# Patient Record
Sex: Female | Born: 1980 | Race: Black or African American | Hispanic: No | Marital: Single | Smoking: Current every day smoker
Health system: Southern US, Community
[De-identification: ages and names within clinical notes are randomized; demographics above are authoritative.]

## PROBLEM LIST (undated history)

## (undated) DIAGNOSIS — D649 Anemia, unspecified: Secondary | ICD-10-CM

## (undated) DIAGNOSIS — I10 Essential (primary) hypertension: Secondary | ICD-10-CM

## (undated) DIAGNOSIS — N809 Endometriosis, unspecified: Secondary | ICD-10-CM

## (undated) HISTORY — DX: Essential (primary) hypertension: I10

## (undated) HISTORY — PX: SALPINGECTOMY: SHX328

## (undated) HISTORY — PX: TONSILLECTOMY: SUR1361

## (undated) HISTORY — DX: Endometriosis, unspecified: N80.9

---

## 2015-12-15 ENCOUNTER — Emergency Department (HOSPITAL_COMMUNITY)
Admission: EM | Admit: 2015-12-15 | Discharge: 2015-12-15 | Disposition: A | Discharge status: Home / Self Care | Payer: Self-pay | Attending: Emergency Medicine | Admitting: Emergency Medicine

## 2015-12-15 ENCOUNTER — Encounter (HOSPITAL_COMMUNITY): Payer: Self-pay

## 2015-12-15 ENCOUNTER — Emergency Department (HOSPITAL_COMMUNITY): Payer: Self-pay

## 2015-12-15 DIAGNOSIS — F17210 Nicotine dependence, cigarettes, uncomplicated: Secondary | ICD-10-CM | POA: Insufficient documentation

## 2015-12-15 DIAGNOSIS — N200 Calculus of kidney: Secondary | ICD-10-CM | POA: Insufficient documentation

## 2015-12-15 DIAGNOSIS — Z3202 Encounter for pregnancy test, result negative: Secondary | ICD-10-CM | POA: Insufficient documentation

## 2015-12-15 DIAGNOSIS — R630 Anorexia: Secondary | ICD-10-CM | POA: Insufficient documentation

## 2015-12-15 DIAGNOSIS — N939 Abnormal uterine and vaginal bleeding, unspecified: Secondary | ICD-10-CM | POA: Insufficient documentation

## 2015-12-15 DIAGNOSIS — R112 Nausea with vomiting, unspecified: Secondary | ICD-10-CM | POA: Insufficient documentation

## 2015-12-15 LAB — COMPREHENSIVE METABOLIC PANEL
ALT: 14 U/L (ref 14–54)
AST: 19 U/L (ref 15–41)
Albumin: 4.2 g/dL (ref 3.5–5.0)
Alkaline Phosphatase: 49 U/L (ref 38–126)
Anion gap: 10 (ref 5–15)
BUN: 11 mg/dL (ref 6–20)
CHLORIDE: 106 mmol/L (ref 101–111)
CO2: 24 mmol/L (ref 22–32)
CREATININE: 0.92 mg/dL (ref 0.44–1.00)
Calcium: 9.4 mg/dL (ref 8.9–10.3)
Glucose, Bld: 117 mg/dL — ABNORMAL HIGH (ref 65–99)
POTASSIUM: 4.1 mmol/L (ref 3.5–5.1)
SODIUM: 140 mmol/L (ref 135–145)
Total Bilirubin: 0.5 mg/dL (ref 0.3–1.2)
Total Protein: 7.1 g/dL (ref 6.5–8.1)

## 2015-12-15 LAB — URINALYSIS, ROUTINE W REFLEX MICROSCOPIC
Bilirubin Urine: NEGATIVE
GLUCOSE, UA: NEGATIVE mg/dL
Ketones, ur: 15 mg/dL — AB
Nitrite: NEGATIVE
PH: 7 (ref 5.0–8.0)
PROTEIN: 100 mg/dL — AB
SPECIFIC GRAVITY, URINE: 1.029 (ref 1.005–1.030)

## 2015-12-15 LAB — I-STAT BETA HCG BLOOD, ED (MC, WL, AP ONLY): I-stat hCG, quantitative: <5 m[IU]/mL (ref <5)

## 2015-12-15 LAB — CBC
HEMATOCRIT: 38.6 % (ref 36.0–46.0)
HEMOGLOBIN: 12.5 g/dL (ref 12.0–15.0)
MCH: 26.4 pg (ref 26.0–34.0)
MCHC: 32.4 g/dL (ref 30.0–36.0)
MCV: 81.4 fL (ref 78.0–100.0)
PLATELETS: 227 10*3/uL (ref 150–400)
RBC: 4.74 MIL/uL (ref 3.87–5.11)
RDW: 13.2 % (ref 11.5–15.5)
WBC: 11.4 10*3/uL — AB (ref 4.0–10.5)

## 2015-12-15 LAB — LIPASE, BLOOD: LIPASE: 25 U/L (ref 11–51)

## 2015-12-15 LAB — URINE MICROSCOPIC-ADD ON

## 2015-12-15 MED ORDER — HYDROMORPHONE HCL 1 MG/ML IJ SOLN
1.0000 mg | Freq: Once | INTRAMUSCULAR | Status: AC
  Administered 2015-12-15: 1 mg via INTRAMUSCULAR
  Filled 2015-12-15: qty 1

## 2015-12-15 MED ORDER — ONDANSETRON 4 MG PO TBDP
ORAL_TABLET | ORAL | Status: AC
  Filled 2015-12-15: qty 1

## 2015-12-15 MED ORDER — ONDANSETRON 4 MG PO TBDP
4.0000 mg | ORAL_TABLET | Freq: Once | ORAL | Status: AC | PRN
Start: 2015-12-15 — End: 2015-12-15
  Administered 2015-12-15: 4 mg via ORAL

## 2015-12-15 MED ORDER — HYDROCODONE-ACETAMINOPHEN 5-325 MG PO TABS: 1.0000 | ORAL_TABLET | ORAL | Status: DC | PRN

## 2015-12-15 MED ORDER — KETOROLAC TROMETHAMINE 30 MG/ML IJ SOLN
30.0000 mg | Freq: Once | INTRAMUSCULAR | Status: AC
  Administered 2015-12-15: 30 mg via INTRAMUSCULAR
  Filled 2015-12-15: qty 1

## 2015-12-15 NOTE — ED Notes (Signed)
Patient with a history of endometriosis and fibroids comes to the ED with C/O abdominal pain.  Onset of abdominal pain was this morning but C/O heavy vaginal bleeding for 3 weeks.  States that she changes pads 7 times per day.  She states that she began passing large blood clots today.  C/O right lower quadrant abdominal pain.  Also C/O painful urination that began today.

## 2015-12-15 NOTE — ED Notes (Signed)
Patient unable to give Urine sample at this time

## 2015-12-15 NOTE — ED Provider Notes (Signed)
CSN: [id number]     Arrival date & time 12/15/15  1426 History   First MD Initiated Contact with Patient 12/15/15 1856     Chief Complaint  Patient presents with  . Abdominal Pain  . Emesis     (Consider location/radiation/quality/duration/timing/severity/associated sxs/prior Treatment) Patient is a 35 y.o. female presenting with abdominal pain and vomiting. The history is provided by the patient.  Abdominal Pain Pain location:  R flank and RLQ Pain quality: burning, cramping, sharp and shooting   Pain radiates to:  R flank Pain severity:  Severe Onset quality:  Sudden Duration:  8 hours Timing:  Constant Progression:  Improving Chronicity:  New Context comment:  Patient states that she has had issues with her menses for the last 3 weeks with an ongoing period but that was starting to improve when today at 10 AM she developed sudden onset severe 10 out of 10 pain causing her to vomit Relieved by:  Nothing Exacerbated by: Difficult to urinate and noticed blood in the urine. Ineffective treatments:  NSAIDs Associated symptoms: anorexia, dysuria, hematuria, nausea, vaginal bleeding and vomiting   Associated symptoms: no cough, no diarrhea, no fever and no vaginal discharge   Risk factors: has not had multiple surgeries and not pregnant   Emesis Associated symptoms: abdominal pain   Associated symptoms: no diarrhea     History reviewed. No pertinent past medical history. Past Surgical History  Procedure Laterality Date  . Tubal ligation     History reviewed. No pertinent family history. Social History  Substance Use Topics  . Smoking status: Current Every Day Smoker -- 0.50 packs/day    Types: Cigarettes  . Smokeless tobacco: None  . Alcohol Use: Yes     Comment: occ   OB History    No data available     Review of Systems  Constitutional: Negative for fever.  Respiratory: Negative for cough.   Gastrointestinal: Positive for nausea, vomiting, abdominal pain and  anorexia. Negative for diarrhea.  Genitourinary: Positive for dysuria, hematuria and vaginal bleeding. Negative for vaginal discharge.      Allergies  Review of patient's allergies indicates no known allergies.  Home Medications   Prior to Admission medications   Not on File   BP 156/91 mmHg  Pulse 57  Temp(Src) 98.1 F (36.7 C) (Oral)  Resp 16  Ht 5\' 1"  (1.549 m)  Wt 140 lb 14.4 oz (63.912 kg)  BMI 26.64 kg/m2  SpO2 100%  LMP 11/24/2015 Physical Exam  Constitutional: She is oriented to person, place, and time. She appears well-developed and well-nourished. No distress.  HENT:  Head: Normocephalic and atraumatic.  Mouth/Throat: Oropharynx is clear and moist.  Eyes: Conjunctivae and EOM are normal. Pupils are equal, round, and reactive to light.  Neck: Normal range of motion. Neck supple.  Cardiovascular: Normal rate, regular rhythm and intact distal pulses.   No murmur heard. Pulmonary/Chest: Effort normal and breath sounds normal. No respiratory distress. She has no wheezes. She has no rales.  Abdominal: Soft. She exhibits no distension. There is tenderness in the right lower quadrant. There is CVA tenderness. There is no rebound and no guarding.  Right CVA tenderness  Musculoskeletal: Normal range of motion. She exhibits no edema or tenderness.  Neurological: She is alert and oriented to person, place, and time.  Skin: Skin is warm and dry. No rash noted. No erythema.  Psychiatric: She has a normal mood and affect. Her behavior is normal.  Nursing note and vitals reviewed.  ED Course  Procedures (including critical care time) Labs Review Labs Reviewed  COMPREHENSIVE METABOLIC PANEL - Abnormal; Notable for the following:    Glucose, Bld 117 (*)    All other components within normal limits  CBC - Abnormal; Notable for the following:    WBC 11.4 (*)    All other components within normal limits  URINALYSIS, ROUTINE W REFLEX MICROSCOPIC (NOT AT [facility]) - Abnormal;  Notable for the following:    Color, Urine RED (*)    APPearance TURBID (*)    Hgb urine dipstick LARGE (*)    Ketones, ur 15 (*)    Protein, ur 100 (*)    Leukocytes, UA SMALL (*)    All other components within normal limits  URINE MICROSCOPIC-ADD ON - Abnormal; Notable for the following:    Squamous Epithelial / LPF 0-5 (*)    Bacteria, UA RARE (*)    All other components within normal limits  LIPASE, BLOOD  I-STAT BETA HCG BLOOD, ED ([facility], [facility], [facility] ONLY)    Imaging Review Ct Abdomen Pelvis Wo Contrast  12/15/2015  CLINICAL DATA:  Vaginal bleeding for 3 weeks, abnormal periods for 3 months. One day of RIGHT lower quadrant pain and dysuria, vomiting and diarrhea. EXAM: CT ABDOMEN AND PELVIS WITHOUT CONTRAST TECHNIQUE: Multidetector CT imaging of the abdomen and pelvis was performed following the standard protocol without IV contrast. COMPARISON:  None. FINDINGS: LUNG BASES: Included view of the lung bases are clear. The visualized heart and pericardium are unremarkable. KIDNEYS/BLADDER: Kidneys are orthotopic, demonstrating normal size and morphology. No nephrolithiasis, hydronephrosis; limited assessment for renal masses on this nonenhanced examination. The unopacified ureters are normal in course and caliber. Urinary bladder is partly distended, with homogeneously dense contents, 38 Hounsfield units. SOLID ORGANS: The liver, spleen, gallbladder, pancreas and adrenal glands are unremarkable for this non-contrast examination. GASTROINTESTINAL TRACT: The stomach, small and large bowel are normal in course and caliber without inflammatory changes, the sensitivity may be decreased by lack of enteric contrast. Normal appendix. PERITONEUM/RETROPERITONEUM: Aortoiliac vessels are normal in course and caliber. No lymphadenopathy by CT size criteria. Internal reproductive organs are unremarkable. No intraperitoneal free fluid nor free air. Phleboliths in the pelvis. SOFT TISSUES/ OSSEOUS STRUCTURES:   Nonsuspicious. IMPRESSION: No urolithiasis or obstructive uropathy. Mildly dense bladder contents, could represent blood products. Recommend correlation with urinary analysis. Normal appendix. Electronically Signed   By: Elon Alas M.D.   On: 12/15/2015 21:02   I have personally reviewed and evaluated these images and lab results as part of my medical decision-making.   EKG Interpretation None      MDM   Final diagnoses:  Kidney stone on right side    Pt with symptoms concerning for right sided kidney stone vs ovarian cyst vs torsion that started at 10am today.  Denies infectious sx, or GI symptoms.  Low concern for diverticulitis and no risk factors or history suggestive of AAA.  pt is healthy.  She denies vaginal d/c and no recent sexual activity.  Pt has RLQ and right flank pain on exam and today has had hematuria.  Patient does have a history of fibroids and endometriosis and states she has had a similar pain with that but not quite as severe as today.  Will  treat pain and ensure no infection with UA WHICH IS GROSSLY BLOODY ON EXAM. CBC, CMP and hCG are all within normal limits except for mild leukocytosis of 11. will get stone study to further eval.  9:32  PM CT showing blood products in the bladder without other acute findings. UA consistent with blood and given patient's history most concerning for a kidney stone. Patient's pain is significantly improved and she is tolerating fluids. Will discharge home with some pain medication and follow-up with urology symptoms do not resolve.   Blanchie Dessert, MD 12/15/15 2132

## 2015-12-15 NOTE — ED Notes (Signed)
Pt stable, ambulatory, states understanding of discharge instructions 

## 2015-12-15 NOTE — Discharge Instructions (Signed)
Kidney Stones Kidney stones (urolithiasis) are deposits that form inside your kidneys. The intense pain is caused by the stone moving through the urinary tract. When the stone moves, the ureter goes into spasm around the stone. The stone is usually passed in the urine.  CAUSES   A disorder that makes certain neck glands produce too much parathyroid hormone (primary hyperparathyroidism).  A buildup of uric acid crystals, similar to gout in your joints.  Narrowing (stricture) of the ureter.  A kidney obstruction present at birth (congenital obstruction).  Previous surgery on the kidney or ureters.  Numerous kidney infections. SYMPTOMS   Feeling sick to your stomach (nauseous).  Throwing up (vomiting).  Blood in the urine (hematuria).  Pain that usually spreads (radiates) to the groin.  Frequency or urgency of urination. DIAGNOSIS   Taking a history and physical exam.  Blood or urine tests.  CT scan.  Occasionally, an examination of the inside of the urinary bladder (cystoscopy) is performed. TREATMENT   Observation.  Increasing your fluid intake.  Extracorporeal shock wave lithotripsy--This is a noninvasive procedure that uses shock waves to break up kidney stones.  Surgery may be needed if you have severe pain or persistent obstruction. There are various surgical procedures. Most of the procedures are performed with the use of small instruments. Only small incisions are needed to accommodate these instruments, so recovery time is minimized. The size, location, and chemical composition are all important variables that will determine the proper choice of action for you. Talk to your health care provider to better understand your situation so that you will minimize the risk of injury to yourself and your kidney.  HOME CARE INSTRUCTIONS   Drink enough water and fluids to keep your urine clear or pale yellow. This will help you to pass the stone or stone fragments.  Strain  all urine through the provided strainer. Keep all particulate matter and stones for your health care provider to see. The stone causing the pain may be as small as a grain of salt. It is very important to use the strainer each and every time you pass your urine. The collection of your stone will allow your health care provider to analyze it and verify that a stone has actually passed. The stone analysis will often identify what you can do to reduce the incidence of recurrences.  Only take over-the-counter or prescription medicines for pain, discomfort, or fever as directed by your health care provider.  Keep all follow-up visits as told by your health care provider. This is important.  Get follow-up X-rays if required. The absence of pain does not always mean that the stone has passed. It may have only stopped moving. If the urine remains completely obstructed, it can cause loss of kidney function or even complete destruction of the kidney. It is your responsibility to make sure X-rays and follow-ups are completed. Ultrasounds of the kidney can show blockages and the status of the kidney. Ultrasounds are not associated with any radiation and can be performed easily in a matter of minutes.  Make changes to your daily diet as told by your health care provider. You may be told to:  Limit the amount of salt that you eat.  Eat 5 or more servings of fruits and vegetables each day.  Limit the amount of meat, poultry, fish, and eggs that you eat.  Collect a 24-hour urine sample as told by your health care provider.You may need to collect another urine sample every 6-12   months. SEEK MEDICAL CARE IF:  You experience pain that is progressive and unresponsive to any pain medicine you have been prescribed. SEEK IMMEDIATE MEDICAL CARE IF:   Pain cannot be controlled with the prescribed medicine.  You have a fever or shaking chills.  The severity or intensity of pain increases over 18 hours and is not  relieved by pain medicine.  You develop a new onset of abdominal pain.  You feel faint or pass out.  You are unable to urinate.   This information is not intended to replace advice given to you by your health care provider. Make sure you discuss any questions you have with your health care provider.   Document Released: 11/23/2005 Document Revised: 08/14/2015 Document Reviewed: 04/26/2013 Elsevier Interactive Patient Education 2016 [employer] Dietary Guidelines to Help Prevent Kidney Stones Your risk of kidney stones can be decreased by adjusting the foods you eat. The most important thing you can do is drink enough fluid. You should drink enough fluid to keep your urine clear or pale yellow. The following guidelines provide specific information for the type of kidney stone you have had. GUIDELINES ACCORDING TO TYPE OF KIDNEY STONE Calcium Oxalate Kidney Stones  Reduce the amount of salt you eat. Foods that have a lot of salt cause your body to release excess calcium into your urine. The excess calcium can combine with a substance called oxalate to form kidney stones.  Reduce the amount of animal protein you eat if the amount you eat is excessive. Animal protein causes your body to release excess calcium into your urine. Ask your dietitian how much protein from animal sources you should be eating.  Avoid foods that are high in oxalates. If you take vitamins, they should have less than 500 mg of vitamin C. Your body turns vitamin C into oxalates. You do not need to avoid fruits and vegetables high in vitamin C. Calcium Phosphate Kidney Stones  Reduce the amount of salt you eat to help prevent the release of excess calcium into your urine.  Reduce the amount of animal protein you eat if the amount you eat is excessive. Animal protein causes your body to release excess calcium into your urine. Ask your dietitian how much protein from animal sources you should be eating.  Get enough  calcium from food or take a calcium supplement (ask your dietitian for recommendations). Food sources of calcium that do not increase your risk of kidney stones include:  Broccoli.  Dairy products, such as cheese and yogurt.  Pudding. Uric Acid Kidney Stones  Do not have more than 6 oz of animal protein per day. FOOD SOURCES Animal Protein Sources  Meat (all types).  Poultry.  Eggs.  Fish, seafood. Foods High in Salt  Salt seasonings.  Soy sauce.  Teriyaki sauce.  Cured and processed meats.  Salted crackers and snack foods.  Fast food.  Canned soups and most canned foods. Foods High in Oxalates  Grains:  Amaranth.  Barley.  Grits.  Wheat germ.  Bran.  Buckwheat flour.  All bran cereals.  Pretzels.  Whole wheat bread.  Vegetables:  Beans (wax).  Beets and beet greens.  Collard greens.  Eggplant.  Escarole.  Leeks.  Okra.  Parsley.  Rutabagas.  Spinach.  Swiss chard.  Tomato paste.  Fried potatoes.  Sweet potatoes.  Fruits:  Red currants.  Figs.  Kiwi.  Rhubarb.  Meat and Other Protein Sources:  Beans (dried).  Soy burgers and other soybean products.  Miso.    Nuts (peanuts, almonds, pecans, cashews, hazelnuts).  Nut butters.  Sesame seeds and tahini (paste made of sesame seeds).  Poppy seeds.  Beverages:  Chocolate drink mixes.  Soy milk.  Instant iced tea.  Juices made from high-oxalate fruits or vegetables.  Other:  Carob.  Chocolate.  Fruitcake.  Marmalades.   This information is not intended to replace advice given to you by your health care provider. Make sure you discuss any questions you have with your health care provider.   Document Released: 03/20/2011 Document Revised: 11/28/2013 Document Reviewed: 10/20/2013 Elsevier Interactive Patient Education 2016 Elsevier Inc.  

## 2015-12-15 NOTE — ED Notes (Signed)
Pt has had vaginal bleeding x 3 weeks.  Abnormal periods x 3 months.  Onset today RLQ abd pain and dysuria.  Vomited x 3 today, diarrhea x 5- loose, unable to determine color d/t vaginal bleeding in toilet.

## 2016-01-08 ENCOUNTER — Ambulatory Visit (INDEPENDENT_AMBULATORY_CARE_PROVIDER_SITE_OTHER): Payer: Self-pay | Admitting: Obstetrics & Gynecology

## 2016-01-08 ENCOUNTER — Encounter: Payer: Self-pay | Admitting: Obstetrics & Gynecology

## 2016-01-08 VITALS — BP 144/71 | HR 69 | Temp 98.4°F | Resp 20 | Ht 61.0 in | Wt 135.4 lb

## 2016-01-08 DIAGNOSIS — N946 Dysmenorrhea, unspecified: Secondary | ICD-10-CM

## 2016-01-08 MED ORDER — NORGESTIMATE-ETH ESTRADIOL 0.25-35 MG-MCG PO TABS: 1.0000 | ORAL_TABLET | Freq: Every day | ORAL | Status: DC

## 2016-01-08 MED ORDER — NAPROXEN 500 MG PO TABS: 500.0000 mg | ORAL_TABLET | Freq: Two times a day (BID) | ORAL | Status: DC

## 2016-01-08 NOTE — Progress Notes (Signed)
Pt was seen @ [facility] for pelvic pain.  Pt has Hx of fibroids and ovarian cysts.

## 2016-01-08 NOTE — Patient Instructions (Signed)

## 2016-01-08 NOTE — Progress Notes (Signed)
Patient ID: Diana Butler, female   DOB: 1980/12/30, 35 y.o.   MRN: [id number] History:  35 y.o. G2P0020 here today for eval of pelvic pain. Pt reports >83months of pelvic pain.  She reports that she was told that she had endometriosis. No surgical dx.  She was seen in the ED 12/15/2015  Pt reports h/o ov cyst.  She also reports /o kidney stones. Pt was diagnosed in [place] with endometriosis and fibroids. Last seen 5 months prev and had a sono at that time. Pain is only with cycles.  But, for the past 3 months pt has had irreg menses.  Pt was on OCP's but, it did not help. She took it less than 2 months and stopped due to side effects.  She thinks it was Orthotricyclen.  Pt was also on Depo Provera as a teenager.  She says she would not get a shot again.    Both fallopian tubes prev removed due to h/o ectopic pregnancies.   The following portions of the patient's history were reviewed and updated as appropriate: allergies, current medications, past family history, past medical history, past social history, past surgical history and problem list.  Review of Systems:  Pertinent items are noted in HPI.  Objective:  Physical Exam Blood pressure 144/71, pulse 69, temperature 98.4 F (36.9 C), temperature source Oral, resp. rate 20, height 5\' 1"  (1.549 m), weight 135 lb 6.4 oz (61.417 kg), last menstrual period 12/15/2015. Gen: NAD Abd: Soft, nontender and nondistended Pelvic: Normal appearing external genitalia; normal appearing vaginal mucosa and cervix.  Normal discharge.  Small uterus, no other palpable masses, no uterine or adnexal tenderness  Labs and Imaging Ct Abdomen Pelvis Wo Contrast  12/15/2015  CLINICAL DATA:  Vaginal bleeding for 3 weeks, abnormal periods for 3 months. One day of RIGHT lower quadrant pain and dysuria, vomiting and diarrhea. EXAM: CT ABDOMEN AND PELVIS WITHOUT CONTRAST TECHNIQUE: Multidetector CT imaging of the abdomen and pelvis was performed following the standard  protocol without IV contrast. COMPARISON:  None. FINDINGS: LUNG BASES: Included view of the lung bases are clear. The visualized heart and pericardium are unremarkable. KIDNEYS/BLADDER: Kidneys are orthotopic, demonstrating normal size and morphology. No nephrolithiasis, hydronephrosis; limited assessment for renal masses on this nonenhanced examination. The unopacified ureters are normal in course and caliber. Urinary bladder is partly distended, with homogeneously dense contents, 38 Hounsfield units. SOLID ORGANS: The liver, spleen, gallbladder, pancreas and adrenal glands are unremarkable for this non-contrast examination. GASTROINTESTINAL TRACT: The stomach, small and large bowel are normal in course and caliber without inflammatory changes, the sensitivity may be decreased by lack of enteric contrast. Normal appendix. PERITONEUM/RETROPERITONEUM: Aortoiliac vessels are normal in course and caliber. No lymphadenopathy by CT size criteria. Internal reproductive organs are unremarkable. No intraperitoneal free fluid nor free air. Phleboliths in the pelvis. SOFT TISSUES/ OSSEOUS STRUCTURES:  Nonsuspicious. IMPRESSION: No urolithiasis or obstructive uropathy. Mildly dense bladder contents, could represent blood products. Recommend correlation with urinary analysis. Normal appendix. Electronically Signed   By: Elon Alas M.D.   On: 12/15/2015 21:02    Assessment & Plan:  Dysmenorrhea Need records from [facility] in [place], Andrew 2 po q day for 4 days and then dfaily F/u in 4 months  Nazaire Cordial L. Harraway-Smith, M.D., Cherlynn June

## 2016-01-09 ENCOUNTER — Encounter: Payer: Self-pay | Admitting: Obstetrics & Gynecology

## 2017-02-25 ENCOUNTER — Ambulatory Visit (HOSPITAL_COMMUNITY): Payer: Self-pay

## 2017-04-27 ENCOUNTER — Other Ambulatory Visit: Payer: Self-pay | Admitting: Obstetrics & Gynecology

## 2017-04-27 DIAGNOSIS — N946 Dysmenorrhea, unspecified: Secondary | ICD-10-CM

## 2017-04-27 MED ORDER — NAPROXEN 500 MG PO TABS: 500.0000 mg | ORAL_TABLET | Freq: Two times a day (BID) | ORAL | 3 refills | Status: DC

## 2017-06-17 ENCOUNTER — Inpatient Hospital Stay (HOSPITAL_COMMUNITY): Payer: Self-pay

## 2017-06-17 ENCOUNTER — Inpatient Hospital Stay (HOSPITAL_COMMUNITY)
Admission: AD | Admit: 2017-06-17 | Discharge: 2017-06-18 | Disposition: A | Discharge status: Home / Self Care | Payer: Self-pay | Source: Ambulatory Visit | Attending: Obstetrics & Gynecology | Admitting: Obstetrics & Gynecology

## 2017-06-17 ENCOUNTER — Encounter (HOSPITAL_COMMUNITY): Payer: Self-pay | Admitting: *Deleted

## 2017-06-17 DIAGNOSIS — D252 Subserosal leiomyoma of uterus: Secondary | ICD-10-CM | POA: Insufficient documentation

## 2017-06-17 DIAGNOSIS — Z8742 Personal history of other diseases of the female genital tract: Secondary | ICD-10-CM | POA: Insufficient documentation

## 2017-06-17 DIAGNOSIS — R1031 Right lower quadrant pain: Secondary | ICD-10-CM | POA: Insufficient documentation

## 2017-06-17 DIAGNOSIS — N946 Dysmenorrhea, unspecified: Secondary | ICD-10-CM | POA: Insufficient documentation

## 2017-06-17 DIAGNOSIS — D259 Leiomyoma of uterus, unspecified: Secondary | ICD-10-CM

## 2017-06-17 DIAGNOSIS — F17210 Nicotine dependence, cigarettes, uncomplicated: Secondary | ICD-10-CM | POA: Insufficient documentation

## 2017-06-17 LAB — COMPREHENSIVE METABOLIC PANEL WITH GFR
ALT: 11 U/L — ABNORMAL LOW (ref 14–54)
AST: 18 U/L (ref 15–41)
Albumin: 4.3 g/dL (ref 3.5–5.0)
Alkaline Phosphatase: 49 U/L (ref 38–126)
Anion gap: 6 (ref 5–15)
BUN: 11 mg/dL (ref 6–20)
CO2: 25 mmol/L (ref 22–32)
Calcium: 9.2 mg/dL (ref 8.9–10.3)
Chloride: 106 mmol/L (ref 101–111)
Creatinine, Ser: 0.94 mg/dL (ref 0.44–1.00)
GFR calc Af Amer: >60 mL/min
GFR calc non Af Amer: >60 mL/min
Glucose, Bld: 97 mg/dL (ref 65–99)
Potassium: 4 mmol/L (ref 3.5–5.1)
Sodium: 137 mmol/L (ref 135–145)
Total Bilirubin: 0.7 mg/dL (ref 0.3–1.2)
Total Protein: 7.4 g/dL (ref 6.5–8.1)

## 2017-06-17 LAB — URINALYSIS, ROUTINE W REFLEX MICROSCOPIC
GLUCOSE, UA: NEGATIVE mg/dL
Ketones, ur: NEGATIVE mg/dL
NITRITE: NEGATIVE
PROTEIN: 100 mg/dL — AB
Specific Gravity, Urine: 1.032 — ABNORMAL HIGH (ref 1.005–1.030)
WBC UA: NONE SEEN WBC/hpf (ref 0–5)
pH: 6 (ref 5.0–8.0)

## 2017-06-17 LAB — DIFFERENTIAL
Basophils Absolute: 0 10*3/uL (ref 0.0–0.1)
Basophils Relative: 0 %
EOS PCT: 0 %
Eosinophils Absolute: 0 10*3/uL (ref 0.0–0.7)
LYMPHS PCT: 13 %
Lymphs Abs: 2.1 10*3/uL (ref 0.7–4.0)
MONO ABS: 0.3 10*3/uL (ref 0.1–1.0)
Monocytes Relative: 2 %
Neutro Abs: 13.3 10*3/uL — ABNORMAL HIGH (ref 1.7–7.7)
Neutrophils Relative %: 85 %

## 2017-06-17 LAB — POCT PREGNANCY, URINE: Preg Test, Ur: NEGATIVE

## 2017-06-17 LAB — WET PREP, GENITAL
Sperm: NONE SEEN
Trich, Wet Prep: NONE SEEN
Yeast Wet Prep HPF POC: NONE SEEN

## 2017-06-17 LAB — CBC
HCT: 37.4 % (ref 36.0–46.0)
Hemoglobin: 12 g/dL (ref 12.0–15.0)
MCH: 24.9 pg — AB (ref 26.0–34.0)
MCHC: 32.1 g/dL (ref 30.0–36.0)
MCV: 77.6 fL — ABNORMAL LOW (ref 78.0–100.0)
PLATELETS: 222 10*3/uL (ref 150–400)
RBC: 4.82 MIL/uL (ref 3.87–5.11)
RDW: 16.2 % — AB (ref 11.5–15.5)
WBC: 15.6 10*3/uL — ABNORMAL HIGH (ref 4.0–10.5)

## 2017-06-17 MED ORDER — IOPAMIDOL (ISOVUE-300) INJECTION 61%
100.0000 mL | Freq: Once | INTRAVENOUS | Status: AC | PRN
  Administered 2017-06-18: 100 mL via INTRAVENOUS

## 2017-06-17 MED ORDER — TRAMADOL HCL 50 MG PO TABS
50.0000 mg | ORAL_TABLET | Freq: Four times a day (QID) | ORAL | Status: DC | PRN
  Administered 2017-06-17: 50 mg via ORAL
  Filled 2017-06-17: qty 1

## 2017-06-17 MED ORDER — IOPAMIDOL (ISOVUE-300) INJECTION 61%
30.0000 mL | INTRAVENOUS | Status: AC
  Administered 2017-06-17 (×2): 30 mL via ORAL

## 2017-06-17 MED ORDER — OXYCODONE-ACETAMINOPHEN 5-325 MG PO TABS
2.0000 | ORAL_TABLET | Freq: Four times a day (QID) | ORAL | Status: DC | PRN
  Administered 2017-06-17: 2 via ORAL
  Filled 2017-06-17: qty 2

## 2017-06-17 NOTE — MAU Note (Signed)
Pain started an hour ago. Patient reportedly passed out at work 45 minutes ago ([employer]) from pain and has taken one naproxen.

## 2017-06-17 NOTE — [facility] Provider Note (Signed)
History     CSN: [id number]  Arrival date and time: 06/17/17 1749   First Provider Initiated Contact with Patient 06/17/17 1809     Chief Complaint  Patient presents with  . Abdominal Pain   Non-pregnant female here with lower abdominal cramping. Sx started today.She used Naproxen and had little relief. The pain worsened about 1 hr ago and she passed out. Rates pain 20/10. Menses started today. She has changed 3 pads since 1300 and passed a few small to moderate sized clots. No fevers. No urinary sx. One episode of emesis since arrival. No new partner in 5 years and current separation from him. She has hx of dysmenorrhea and feels the cramping is related.    Past Medical History:  Diagnosis Date  . Endometriosis     Past Surgical History:  Procedure Laterality Date  . TONSILLECTOMY     age 36  . TUBAL LIGATION      No family history on file.  Social History  Substance Use Topics  . Smoking status: Current Every Day Smoker    Packs/day: 0.25    Years: 10.00    Types: Cigarettes  . Smokeless tobacco: Not on file  . Alcohol use Yes     Comment: occ    Allergies: No Known Allergies  Prescriptions Prior to Admission  Medication Sig Dispense Refill Last Dose  . naproxen (NAPROSYN) 500 MG tablet Take 1 tablet (500 mg total) by mouth 2 (two) times daily with a meal. 40 tablet 3 06/17/2017 at 1400  . HYDROcodone-acetaminophen (NORCO/VICODIN) 5-325 MG tablet Take 1-2 tablets by mouth every 4 (four) hours as needed. 6 tablet 0 More than a month at Unknown time  . ibuprofen (ADVIL,MOTRIN) 200 MG tablet Take 200 mg by mouth every 6 (six) hours as needed.   More than a month at Unknown time  . norgestimate-ethinyl estradiol (ORTHO-CYCLEN,SPRINTEC,PREVIFEM) 0.25-35 MG-MCG tablet Take 1 tablet by mouth daily. 1 Package 11 More than a month at Unknown time    Review of Systems Physical Exam   Blood pressure (!) 114/95, pulse (!) 57, temperature 97.9 F (36.6 C), temperature source  Oral, resp. rate (!) 22, height 5\' 1"  (1.549 m), weight 147 lb (66.7 kg).  Physical Exam  Constitutional: She is oriented to person, place, and time. She appears well-developed and well-nourished. No distress (appears uncomfortable).  HENT:  Head: Normocephalic and atraumatic.  Neck: Normal range of motion.  Respiratory: Effort normal. No respiratory distress.  GI: Soft. She exhibits no distension. There is tenderness in the right lower quadrant. There is no rigidity, no rebound and no guarding.  Genitourinary:  Genitourinary Comments: External: no lesions or erythema Vagina: rugated, pink, moist, small drk bloody discharge Uterus: non enlarged, anteverted, non tender, no CMT Adnexae: no masses, no tenderness left, + tenderness right   Musculoskeletal: Normal range of motion.  Neurological: She is alert and oriented to person, place, and time.  Skin: Skin is warm and dry.  Psychiatric: She has a normal mood and affect.   Results for orders placed or performed during the hospital encounter of 06/17/17 (from the past 24 hour(s))  Wet prep, genital     Status: Abnormal   Collection Time: 06/17/17  6:20 PM  Result Value Ref Range   Yeast Wet Prep HPF POC NONE SEEN NONE SEEN   Trich, Wet Prep NONE SEEN NONE SEEN   Clue Cells Wet Prep HPF POC PRESENT (A) NONE SEEN   WBC, Wet Prep HPF POC FEW (  A) NONE SEEN   Sperm NONE SEEN   Urinalysis, Routine w reflex microscopic     Status: Abnormal   Collection Time: 06/17/17  6:20 PM  Result Value Ref Range   Color, Urine AMBER (A) YELLOW   APPearance HAZY (A) CLEAR   Specific Gravity, Urine 1.032 (H) 1.005 - 1.030   pH 6.0 5.0 - 8.0   Glucose, UA NEGATIVE NEGATIVE mg/dL   Hgb urine dipstick MODERATE (A) NEGATIVE   Bilirubin Urine SMALL (A) NEGATIVE   Ketones, ur NEGATIVE NEGATIVE mg/dL   Protein, ur 100 (A) NEGATIVE mg/dL   Nitrite NEGATIVE NEGATIVE   Leukocytes, UA SMALL (A) NEGATIVE   RBC / HPF TOO NUMEROUS TO COUNT 0 - 5 RBC/hpf   WBC,  UA NONE SEEN 0 - 5 WBC/hpf   Bacteria, UA FEW (A) NONE SEEN   Squamous Epithelial / LPF 6-30 (A) NONE SEEN   Mucous PRESENT   CBC     Status: Abnormal   Collection Time: 06/17/17  6:54 PM  Result Value Ref Range   WBC 15.6 (H) 4.0 - 10.5 K/uL   RBC 4.82 3.87 - 5.11 MIL/uL   Hemoglobin 12.0 12.0 - 15.0 g/dL   HCT 37.4 36.0 - 46.0 %   MCV 77.6 (L) 78.0 - 100.0 fL   MCH 24.9 (L) 26.0 - 34.0 pg   MCHC 32.1 30.0 - 36.0 g/dL   RDW 16.2 (H) 11.5 - 15.5 %   Platelets 222 150 - 400 K/uL  Differential     Status: Abnormal   Collection Time: 06/17/17  6:54 PM  Result Value Ref Range   Neutrophils Relative % 85 %   Neutro Abs 13.3 (H) 1.7 - 7.7 K/uL   Lymphocytes Relative 13 %   Lymphs Abs 2.1 0.7 - 4.0 K/uL   Monocytes Relative 2 %   Monocytes Absolute 0.3 0.1 - 1.0 K/uL   Eosinophils Relative 0 %   Eosinophils Absolute 0.0 0.0 - 0.7 K/uL   Basophils Relative 0 %   Basophils Absolute 0.0 0.0 - 0.1 K/uL  Comprehensive metabolic panel     Status: Abnormal   Collection Time: 06/17/17  6:55 PM  Result Value Ref Range   Sodium 137 135 - 145 mmol/L   Potassium 4.0 3.5 - 5.1 mmol/L   Chloride 106 101 - 111 mmol/L   CO2 25 22 - 32 mmol/L   Glucose, Bld 97 65 - 99 mg/dL   BUN 11 6 - 20 mg/dL   Creatinine, Ser 0.94 0.44 - 1.00 mg/dL   Calcium 9.2 8.9 - 10.3 mg/dL   Total Protein 7.4 6.5 - 8.1 g/dL   Albumin 4.3 3.5 - 5.0 g/dL   AST 18 15 - 41 U/L   ALT 11 (L) 14 - 54 U/L   Alkaline Phosphatase 49 38 - 126 U/L   Total Bilirubin 0.7 0.3 - 1.2 mg/dL   GFR calc non Af Amer >60 >60 mL/min   GFR calc Af Amer >60 >60 mL/min   Anion gap 6 5 - 15  Pregnancy, urine POC     Status: None   Collection Time: 06/17/17  7:15 PM  Result Value Ref Range   Preg Test, Ur NEGATIVE NEGATIVE   US Transvaginal Non-ob  Result Date: 06/17/2017 CLINICAL DATA:  RIGHT lower quadrant pain. History of endometriosis. EXAM: TRANSABDOMINAL AND TRANSVAGINAL ULTRASOUND OF PELVIS TECHNIQUE: Both transabdominal and  transvaginal ultrasound examinations of the pelvis were performed. Transabdominal technique was performed for global imaging of  the pelvis including uterus, ovaries, adnexal regions, and pelvic cul-de-sac. It was necessary to proceed with endovaginal exam following the transabdominal exam to visualize the endometrium. COMPARISON:  None FINDINGS: Uterus Measurements: 8 x 4.3 x 4.7 cm. Isoechoic exophytic RIGHT uterine fundal 2.8 x 1.8 x 2.2 cm intramural to subserosal. 6 x 6 x 6 mm LEFT uterine fundal subserosal leiomyoma. Endometrium Thickness: 15 mm. 2.1 x 1 x 1.5 cm submucosal heterogeneous mass with marginal vascularity. Right ovary Measurements: 3.2 x 2.3 x 2.4 cm. Normal appearance/no adnexal mass. Left ovary Measurements: 2.5 x 1.8 x 2.2 cm. Normal appearance/no adnexal mass. Other findings No abnormal free fluid. IMPRESSION: 1. 2.1 x 1.5 x 1.5 cm submucosal leiomyoma, less likely polyp. Findings may be better characterized with hysteroscopy from nonemergent basis. 2. 2 additional leiomyomas, measuring to 2.8 cm. Electronically Signed   By: Elon Alas M.D.   On: 06/17/2017 22:00   US Pelvis Complete  Result Date: 06/17/2017 CLINICAL DATA:  RIGHT lower quadrant pain. History of endometriosis. EXAM: TRANSABDOMINAL AND TRANSVAGINAL ULTRASOUND OF PELVIS TECHNIQUE: Both transabdominal and transvaginal ultrasound examinations of the pelvis were performed. Transabdominal technique was performed for global imaging of the pelvis including uterus, ovaries, adnexal regions, and pelvic cul-de-sac. It was necessary to proceed with endovaginal exam following the transabdominal exam to visualize the endometrium. COMPARISON:  None FINDINGS: Uterus Measurements: 8 x 4.3 x 4.7 cm. Isoechoic exophytic RIGHT uterine fundal 2.8 x 1.8 x 2.2 cm intramural to subserosal. 6 x 6 x 6 mm LEFT uterine fundal subserosal leiomyoma. Endometrium Thickness: 15 mm. 2.1 x 1 x 1.5 cm submucosal heterogeneous mass with marginal  vascularity. Right ovary Measurements: 3.2 x 2.3 x 2.4 cm. Normal appearance/no adnexal mass. Left ovary Measurements: 2.5 x 1.8 x 2.2 cm. Normal appearance/no adnexal mass. Other findings No abnormal free fluid. IMPRESSION: 1. 2.1 x 1.5 x 1.5 cm submucosal leiomyoma, less likely polyp. Findings may be better characterized with hysteroscopy from nonemergent basis. 2. 2 additional leiomyomas, measuring to 2.8 cm. Electronically Signed   By: Elon Alas M.D.   On: 06/17/2017 22:00   Ct Abdomen Pelvis W Contrast  Result Date: 06/18/2017 CLINICAL DATA:  Right lower quadrant pain. Lower abdominal cramping. EXAM: CT ABDOMEN AND PELVIS WITH CONTRAST TECHNIQUE: Multidetector CT imaging of the abdomen and pelvis was performed using the standard protocol following bolus administration of intravenous contrast. CONTRAST:  122mL ISOVUE-300 IOPAMIDOL (ISOVUE-300) INJECTION 61% COMPARISON:  Pelvic ultrasound 3 hours prior. Noncontrast CT 12/15/2015 FINDINGS: Lower chest: The lung bases are clear. No consolidation or pleural fluid. Hepatobiliary: No focal liver abnormality is seen. No gallstones, gallbladder wall thickening, or biliary dilatation. Pancreas: No ductal dilatation or inflammation. Spleen: Normal in size without focal abnormality. Splenule at the hilum. Adrenals/Urinary Tract: Normal adrenal glands. No hydronephrosis or perinephric edema. Homogeneous renal enhancement. Urinary bladder is physiologically distended, no bladder wall thickening. Stomach/Bowel: Stomach is physiologically distended. No bowel inflammation, wall thickening or abnormal distention. Normal appendix. Small to moderate colonic stool burden. Vascular/Lymphatic: Incidental note of retroaortic left renal vein. Abdominal aorta is normal in caliber. No abdominal or pelvic adenopathy. Reproductive: Heterogeneous fibroid uterus, likely due to underlying fibroids as seen on recent pelvic ultrasound. Ovaries are symmetric in size. Other: No free  air, free fluid, or intra-abdominal fluid collection. Musculoskeletal: There are no acute or suspicious osseous abnormalities. IMPRESSION: Normal appendix.  No acute abnormality in the abdomen/pelvis. Uterine fibroids, better characterized on pelvic ultrasound. Electronically Signed   By: Fonnie Birkenhead.D.  On: 06/18/2017 00:42   MAU Course  Procedures Ultram Percocet  MDM Labs ordered and reviewed. Pelvic US ordered. CT of abd and pelvis ordered. Transfer of care given to [facility], . Julianne Handler, CNM  06/17/2017 10:13 PM   Pt remains afebrile & VSS Pain resolved with percocet CT scan negative Ultrasound shows small uterine fibroid Patient has previously been seen in [facility] for dysmenorrhea & would like to return there. Assessment and Plan  A:  1. Dysmenorrhea   2. RLQ abdominal pain   3. Uterine leiomyoma, unspecified location    P; Discharge home Rx percocet Msg to [facility] for f/u appt Discussed reasons to return to MAU vs ED  Jorje Guild, NP

## 2017-06-18 DIAGNOSIS — D259 Leiomyoma of uterus, unspecified: Secondary | ICD-10-CM

## 2017-06-18 DIAGNOSIS — N946 Dysmenorrhea, unspecified: Secondary | ICD-10-CM

## 2017-06-18 DIAGNOSIS — R1031 Right lower quadrant pain: Secondary | ICD-10-CM

## 2017-06-18 LAB — GC/CHLAMYDIA PROBE AMP (~~LOC~~) NOT AT ARMC
Chlamydia: NEGATIVE
NEISSERIA GONORRHEA: NEGATIVE

## 2017-06-18 MED ORDER — OXYCODONE-ACETAMINOPHEN 5-325 MG PO TABS: 1.0000 | ORAL_TABLET | Freq: Four times a day (QID) | ORAL | 0 refills | Status: DC | PRN

## 2017-06-18 NOTE — Discharge Instructions (Signed)
Dysmenorrhea °Menstrual cramps (dysmenorrhea) are caused by the muscles of the uterus tightening (contracting) during a menstrual period. For some women, this discomfort is merely bothersome. For others, dysmenorrhea can be severe enough to interfere with everyday activities for a few days each month. °Primary dysmenorrhea is menstrual cramps that last a couple of days when you start having menstrual periods or soon after. This often begins after a teenager starts having her period. As a woman gets older or has a baby, the cramps will usually lessen or disappear. Secondary dysmenorrhea begins later in life, lasts longer, and the pain may be stronger than primary dysmenorrhea. The pain may start before the period and last a few days after the period. °What are the causes? °Dysmenorrhea is usually caused by an underlying problem, such as: °· The tissue lining the uterus grows outside of the uterus in other areas of the body (endometriosis). °· The endometrial tissue, which normally lines the uterus, is found in or grows into the muscular walls of the uterus (adenomyosis). °· The pelvic blood vessels are engorged with blood just before the menstrual period (pelvic congestive syndrome). °· Overgrowth of cells (polyps) in the lining of the uterus or cervix. °· Falling down of the uterus (prolapse) because of loose or stretched ligaments. °· Depression. °· Bladder problems, infection, or inflammation. °· Problems with the intestine, a tumor, or irritable bowel syndrome. °· Cancer of the female organs or bladder. °· A severely tipped uterus. °· A very tight opening or closed cervix. °· Noncancerous tumors of the uterus (fibroids). °· Pelvic inflammatory disease (PID). °· Pelvic scarring (adhesions) from a previous surgery. °· Ovarian cyst. °· An intrauterine device (IUD) used for birth control. °What increases the risk? °You may be at greater risk of dysmenorrhea if: °· You are younger than age 30. °· You started puberty  early. °· You have irregular or heavy bleeding. °· You have never given birth. °· You have a family history of this problem. °· You are a smoker. °What are the signs or symptoms? °· Cramping or throbbing pain in your lower abdomen. °· Headaches. °· Lower back pain. °· Nausea or vomiting. °· Diarrhea. °· Sweating or dizziness. °· Loose stools. °How is this diagnosed? °A diagnosis is based on your history, symptoms, physical exam, diagnostic tests, or procedures. Diagnostic tests or procedures may include: °· Blood tests. °· Ultrasonography. °· An examination of the lining of the uterus (dilation and curettage, D&C). °· An examination inside your abdomen or pelvis with a scope (laparoscopy). °· X-rays. °· CT scan. °· MRI. °· An examination inside the bladder with a scope (cystoscopy). °· An examination inside the intestine or stomach with a scope (colonoscopy, gastroscopy). °How is this treated? °Treatment depends on the cause of the dysmenorrhea. Treatment may include: °· Pain medicine prescribed by your health care provider. °· Birth control pills or an IUD with progesterone hormone in it. °· Hormone replacement therapy. °· Nonsteroidal anti-inflammatory drugs (NSAIDs). These may help stop the production of prostaglandins. °· Surgery to remove adhesions, endometriosis, ovarian cyst, or fibroids. °· Removal of the uterus (hysterectomy). °· Progesterone shots to stop the menstrual period. °· Cutting the nerves on the sacrum that go to the female organs (presacral neurectomy). °· Electric current to the sacral nerves (sacral nerve stimulation). °· Antidepressant medicine. °· Psychiatric therapy, counseling, or group therapy. °· Exercise and physical therapy. °· Meditation and yoga therapy. °· Acupuncture. °Follow these instructions at home: °· Only take over-the-counter or prescription medicines as directed   by your health care provider. °· Place a heating pad or hot water bottle on your lower back or abdomen. Do not  sleep with the heating pad. °· Use aerobic exercises, walking, swimming, biking, and other exercises to help lessen the cramping. °· Massage to the lower back or abdomen may help. °· Stop smoking. °· Avoid alcohol and caffeine. °Contact a health care provider if: °· Your pain does not get better with medicine. °· You have pain with sexual intercourse. °· Your pain increases and is not controlled with medicines. °· You have abnormal vaginal bleeding with your period. °· You develop nausea or vomiting with your period that is not controlled with medicine. °Get help right away if: °You pass out. °This information is not intended to replace advice given to you by your health care provider. Make sure you discuss any questions you have with your health care provider. °Document Released: 11/23/2005 Document Revised: 04/30/2016 Document Reviewed: 05/11/2013 °Elsevier Interactive Patient Education © 2017 [employer]. ° °

## 2017-07-22 ENCOUNTER — Encounter: Payer: Self-pay | Admitting: Obstetrics and Gynecology

## 2017-07-22 ENCOUNTER — Ambulatory Visit (INDEPENDENT_AMBULATORY_CARE_PROVIDER_SITE_OTHER): Payer: Self-pay | Admitting: Clinical

## 2017-07-22 ENCOUNTER — Ambulatory Visit (INDEPENDENT_AMBULATORY_CARE_PROVIDER_SITE_OTHER): Payer: Self-pay | Admitting: Obstetrics and Gynecology

## 2017-07-22 VITALS — BP 138/90 | HR 69 | Wt 148.7 lb

## 2017-07-22 DIAGNOSIS — F329 Major depressive disorder, single episode, unspecified: Secondary | ICD-10-CM

## 2017-07-22 DIAGNOSIS — D72829 Elevated white blood cell count, unspecified: Secondary | ICD-10-CM | POA: Insufficient documentation

## 2017-07-22 DIAGNOSIS — N946 Dysmenorrhea, unspecified: Secondary | ICD-10-CM | POA: Insufficient documentation

## 2017-07-22 DIAGNOSIS — F32A Depression, unspecified: Secondary | ICD-10-CM | POA: Insufficient documentation

## 2017-07-22 DIAGNOSIS — F320 Major depressive disorder, single episode, mild: Secondary | ICD-10-CM

## 2017-07-22 DIAGNOSIS — D219 Benign neoplasm of connective and other soft tissue, unspecified: Secondary | ICD-10-CM | POA: Insufficient documentation

## 2017-07-22 DIAGNOSIS — Z720 Tobacco use: Secondary | ICD-10-CM | POA: Insufficient documentation

## 2017-07-22 DIAGNOSIS — N920 Excessive and frequent menstruation with regular cycle: Secondary | ICD-10-CM

## 2017-07-22 DIAGNOSIS — D72828 Other elevated white blood cell count: Secondary | ICD-10-CM

## 2017-07-22 MED ORDER — KETOROLAC TROMETHAMINE 10 MG PO TABS: 10.0000 mg | ORAL_TABLET | Freq: Four times a day (QID) | ORAL | 3 refills | Status: DC | PRN

## 2017-07-22 MED ORDER — FLUOXETINE HCL 10 MG PO CAPS: ORAL_CAPSULE | ORAL | 1 refills | Status: DC

## 2017-07-22 MED ORDER — KETOROLAC TROMETHAMINE 10 MG PO TABS: 10.0000 mg | ORAL_TABLET | Freq: Four times a day (QID) | ORAL | 0 refills | Status: DC | PRN

## 2017-07-22 MED ORDER — IBUPROFEN 200 MG PO TABS: ORAL_TABLET | ORAL | 0 refills | Status: DC

## 2017-07-22 MED ORDER — NORETHINDRONE ACETATE 5 MG PO TABS: 5.0000 mg | ORAL_TABLET | Freq: Every day | ORAL | 6 refills | Status: DC

## 2017-07-22 NOTE — Progress Notes (Signed)
Obstetrics and Gynecology Return Patient Evaluation  Appointment Date: 07/22/2017  OBGYN Clinic: [facility]  Primary Care Provider: None  Referring Provider: MAU  Chief Complaint:  Chief Complaint  Patient presents with  . Gynecologic Exam    History of Present Illness: Diana Butler is a 36 y.o. African-American [id number] (Patient's last menstrual period was 07/16/2017 (exact date).), seen for the above chief complaint.  PMHx significant for HTN, h/o BTL and patient endorsed endo.   Patient seen in MAU on 7/12 for abdominal pain. She had negative UPT, cmp, cbc (WBC 15), wet prep, GC-CT and contaminated U/A but appears negative. Also negative CT. She had an u/s that showed 2-3cm exophytic fibroid and 2cm submucosal fibroid (see below). She was discharged to home with outpatient follow up and PO PRN pain medications.  Patient states that she has heavy and painful periods but none outside of her periods. She currently doesn't have any bleeding or pain.    Review of Systems:  as noted in the History of Present Illness.  Past Medical History:  Past Medical History:  Diagnosis Date  . Endometriosis    per patient report.   . Hypertension     Past Surgical History:  Past Surgical History:  Procedure Laterality Date  . SALPINGECTOMY     right, ectopic. pt states had l/s and ex-lap  . SALPINGECTOMY     left, ectopic. pt states had l/s and ex-lap  . TONSILLECTOMY     age 33    Past Obstetrical History:  OB History  Gravida Para Term Preterm AB Living  2       2 0  SAB TAB Ectopic Multiple Live Births      2        # Outcome Date GA Lbr Len/2nd Weight Sex Delivery Anes PTL Lv  2 Ectopic           1 Ectopic               Past Gynecological History: As per HPI. qmonth periods, regular, lasts for 5-6d History of Pap Smear(s): Yes.   Last pap 2017, which was negative per patient History of STI(s): Yes.   She is currently using h/o bilateral  salpingectomy for contraception.   Social History:  Social History   Social History  . Marital status: Single    Spouse name: N/A  . Number of children: N/A  . Years of education: N/A   Occupational History  . Not on file.   Social History Main Topics  . Smoking status: Former Smoker    Packs/day: 0.25    Years: 10.00    Types: Cigarettes    Quit date: 06/21/2017  . Smokeless tobacco: Never Used  . Alcohol use Yes     Comment: occ  . Drug use: No  . Sexual activity: Yes    Birth control/ protection: Surgical   Other Topics Concern  . Not on file   Social History Narrative  . No narrative on file    Family History: She denies any female cancers, bleeding or blood clotting disorders.    Medications Ms. Sportsman had no medications administered during this visit. Current Outpatient Prescriptions  Medication Sig Dispense Refill  . oxyCODONE-acetaminophen (PERCOCET/ROXICET) 5-325 MG tablet Take 1-2 tablets by mouth every 6 (six) hours as needed. 20 tablet 0   No current facility-administered medications for this visit.     Allergies Patient has no known allergies.   Physical Exam:  BP  138/90   Pulse 69   Wt 148 lb 11.2 oz (67.4 kg)   LMP 07/16/2017 (Exact Date)   BMI 28.10 kg/m  Body mass index is 28.1 kg/m. General appearance: Well nourished, well developed female in no acute distress.  Cardiovascular: normal s1 and s2.  No murmurs, rubs or gallops. Respiratory:  Clear to auscultation bilateral. Normal respiratory effort Abdomen: positive bowel sounds and no masses, hernias; diffusely non tender to palpation, non distended. Well healed low transverse incision approx 6-7cm.  Neuro/Psych:  Normal mood and affect.  Skin:  Warm and dry.  Lymphatic:  No inguinal lymphadenopathy.   Pelvic exam: deferred Negative exam in MAU  Laboratory: as per HPI  Radiology:  CLINICAL DATA:  Right lower quadrant pain. Lower abdominal cramping.  EXAM: CT ABDOMEN AND  PELVIS WITH CONTRAST  TECHNIQUE: Multidetector CT imaging of the abdomen and pelvis was performed using the standard protocol following bolus administration of intravenous contrast.  CONTRAST:  183mL ISOVUE-300 IOPAMIDOL (ISOVUE-300) INJECTION 61%  COMPARISON:  Pelvic ultrasound 3 hours prior. Noncontrast CT 12/15/2015  FINDINGS: Lower chest: The lung bases are clear. No consolidation or pleural fluid.  Hepatobiliary: No focal liver abnormality is seen. No gallstones, gallbladder wall thickening, or biliary dilatation.  Pancreas: No ductal dilatation or inflammation.  Spleen: Normal in size without focal abnormality. Splenule at the hilum.  Adrenals/Urinary Tract: Normal adrenal glands. No hydronephrosis or perinephric edema. Homogeneous renal enhancement. Urinary bladder is physiologically distended, no bladder wall thickening.  Stomach/Bowel: Stomach is physiologically distended. No bowel inflammation, wall thickening or abnormal distention. Normal appendix. Small to moderate colonic stool burden.  Vascular/Lymphatic: Incidental note of retroaortic left renal vein. Abdominal aorta is normal in caliber. No abdominal or pelvic adenopathy.  Reproductive: Heterogeneous fibroid uterus, likely due to underlying fibroids as seen on recent pelvic ultrasound. Ovaries are symmetric in size.  Other: No free air, free fluid, or intra-abdominal fluid collection.  Musculoskeletal: There are no acute or suspicious osseous abnormalities.  IMPRESSION: Normal appendix.  No acute abnormality in the abdomen/pelvis.  Uterine fibroids, better characterized on pelvic ultrasound.   Electronically Signed   By: Jeb Levering M.D.   On: 06/18/2017 00:42  CLINICAL DATA:  RIGHT lower quadrant pain. History of endometriosis.  EXAM: TRANSABDOMINAL AND TRANSVAGINAL ULTRASOUND OF PELVIS  TECHNIQUE: Both transabdominal and transvaginal ultrasound examinations of  the pelvis were performed. Transabdominal technique was performed for global imaging of the pelvis including uterus, ovaries, adnexal regions, and pelvic cul-de-sac. It was necessary to proceed with endovaginal exam following the transabdominal exam to visualize the endometrium.  COMPARISON:  None  FINDINGS: Uterus  Measurements: 8 x 4.3 x 4.7 cm. Isoechoic exophytic RIGHT uterine fundal 2.8 x 1.8 x 2.2 cm intramural to subserosal. 6 x 6 x 6 mm LEFT uterine fundal subserosal leiomyoma.  Endometrium  Thickness: 15 mm. 2.1 x 1 x 1.5 cm submucosal heterogeneous mass with marginal vascularity.  Right ovary  Measurements: 3.2 x 2.3 x 2.4 cm. Normal appearance/no adnexal mass.  Left ovary  Measurements: 2.5 x 1.8 x 2.2 cm. Normal appearance/no adnexal mass.  Other findings  No abnormal free fluid.  IMPRESSION: 1. 2.1 x 1.5 x 1.5 cm submucosal leiomyoma, less likely polyp. Findings may be better characterized with hysteroscopy from nonemergent basis. 2. 2 additional leiomyomas, measuring to 2.8 cm.   Electronically Signed   By: Elon Alas M.D.   On: 06/17/2017 22:00  Assessment: fibroid uterus, dysmenorrhea and menorrhagia. Pt stable  Plan:  1. GYN Patient  is without insurance, but she's unsure if she would ever want to try to get pregnant by IVF in the future. D/w her that if she is unsure of her fertility options then medications and myomectomy (can try l/s approach for pedunculated fibroid and hysteroscopic myomectomy for the submucosal fibroid) would be the best options to consider. Pt thinks she may get coverage sometime in the next few months. In the interim, I recommend Aygestin qday for cycle and endo suppression. Toradol also sent in   2. Depression, unspecified depression type Seen by [facility] today and d/w her re: s/s and depression screen and options. Pt amenable to starting an SSRI after r/b/a d/w. Will see her back in 3wks and referral  information for community health and wellness given to patient to follow up on this but also her HTN - Ambulatory referral to [place]  3. Leukocytosis Likely reactionary to period. Rpt nv.   RTC 3wks to follow up depression.   Durene Romans MD Attending [facility] [employer])

## 2017-07-22 NOTE — BH Specialist Note (Signed)
Integrated Behavioral Health Initial Visit  MRN: [id number] Name: Diana Butler   Session Start time: 3:50 Session End time: 4:20 Total time: 30 minutes  Type of Service: [place] Interpretor:No. Interpretor Name and Language: n/a   Warm Hand Off Completed.       SUBJECTIVE: Diana Butler is a 36 y.o. female accompanied by patient. Patient was referred by Dr Ilda Basset for depression. Patient reports the following symptoms/concerns: Pt states her primary concern is feeling depressed, low motivation, physical pain interfering with sleep, irritability,panic attacks, worry, and exhaustion. Pt open to learning self-coping strategy for pain/stress/anxiety, and would like to start antidepressant.  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 Self-Care: Sleeping and eating affected by depression and pain Life Changes: Increased pain; no other known life changes  GOALS ADDRESSED: Patient will reduce symptoms of: agitation, anxiety, depression and stress and increase knowledge and/or ability of: self-management skills and stress reduction and also: Increase healthy adjustment to current life circumstances   INTERVENTIONS: Mindfulness or Relaxation Training and Psychoeducation and/or Health Education  Standardized Assessments completed: GAD-7 and PHQ 9  ASSESSMENT: Patient currently experiencing Major depressive disorder, single episode, mild. Patient may benefit from psychoeducation and brief therapeutic intervention regarding coping with symptoms of depression(along with anxiety).  PLAN: 1. Follow up with behavioral health clinician on : Three weeks in office (phone f/u for [facility] med  management on 07-26-17) 2. Behavioral recommendations:  -Pick up [place] medication at pharmacy by 07-23-17, and take as prescribed -CALM relaxation breathing exercise every morning before work daily; throughout the day as needed -Continue using sleep sounds at bedtime -Read educational materials regarding coping with symptoms of depression and anxiety with panic attacks 3. Referral(s): [facility]) 4. "From scale of 1-10, how likely are you to follow plan?": 10  Garlan Fair, LCSWA   Depression screen [facility] 2/9 07/22/2017  Decreased Interest 2  Down, Depressed, Hopeless 2  PHQ - 2 Score 4  Altered sleeping 2  Tired, decreased energy 2  Change in appetite 2  Feeling bad or failure about yourself  1  Trouble concentrating 1  Moving slowly or fidgety/restless 1  Suicidal thoughts 0  PHQ-9 Score 13   GAD 7 : Generalized Anxiety Score 07/22/2017  Nervous, Anxious, on Edge 1  Control/stop worrying 2  Worry too much - different things 2  Trouble relaxing 1  Restless 1  Easily annoyed or irritable 2  Afraid - awful might happen 1  Total GAD 7 Score 10

## 2017-07-22 NOTE — Progress Notes (Signed)
Patient here for ED f/u. Stated her issue is endometriosis. PHQ-9 and GAD elevated, Patient and/or legal guardian verbally consented to meet with [place] about presenting concerns.

## 2017-07-23 ENCOUNTER — Encounter: Payer: Self-pay | Admitting: Obstetrics & Gynecology

## 2017-07-26 ENCOUNTER — Telehealth: Payer: Self-pay | Admitting: Clinical

## 2017-07-26 NOTE — Telephone Encounter (Signed)
Left HIPPA-compliant message to return call to Jamie at [facility] at [phone].      

## 2017-07-27 ENCOUNTER — Telehealth: Payer: Self-pay | Admitting: Clinical

## 2017-07-27 NOTE — Telephone Encounter (Signed)
Integrated Behavioral Health Medication Management Phone Note  MRN: [id number] NAME: Diana Butler  Time Call Initiated: 1:20 Time Call Completed: 1:23 Total Call Time: 3 minutes  Current Medications:  Outpatient Medications Prior to Visit  Medication Sig Dispense Refill  . FLUoxetine (PROZAC) 10 MG capsule One tab po qday x1wk. Then two tabs po qday thereafter. 60 capsule 1  . ibuprofen (ADVIL,MOTRIN) 200 MG tablet 600mg  po q6h for two days once your period starts and then q6h as needed for pain 30 tablet 0  . ketorolac (TORADOL) 10 MG tablet Take 1 tablet (10 mg total) by mouth every 6 (six) hours as needed for severe pain. 20 tablet 3  . norethindrone (AYGESTIN) 5 MG tablet Take 1 tablet (5 mg total) by mouth daily. 60 tablet 6  . oxyCODONE-acetaminophen (PERCOCET/ROXICET) 5-325 MG tablet Take 1-2 tablets by mouth every 6 (six) hours as needed. 20 tablet 0   No facility-administered medications prior to visit.     Patient has been able to get all medications filled as prescribed: Yes  Patient is currently taking all medications as prescribed: Yes  Patient reports experiencing side effects: No  Patient describes feeling this way on medications: no change yet  Additional patient concerns: no concerns  Patient advised to schedule appointment with provider for evaluation of medication side effects or additional concerns: Yes- Has upcoming appointments on 08-20-17 to see medical provider and behavioral health clinician   Garlan Fair, LCSWA

## 2017-08-12 ENCOUNTER — Ambulatory Visit: Payer: Self-pay

## 2017-08-12 ENCOUNTER — Ambulatory Visit: Payer: Self-pay | Admitting: Obstetrics and Gynecology

## 2017-08-20 ENCOUNTER — Ambulatory Visit: Payer: Self-pay

## 2017-08-20 ENCOUNTER — Encounter (INDEPENDENT_AMBULATORY_CARE_PROVIDER_SITE_OTHER): Payer: Self-pay | Admitting: Obstetrics & Gynecology

## 2017-08-20 NOTE — Progress Notes (Signed)
Pt here today for f/u on depression medications.  Pt reports not needing to see Roselyn Reef because the medication that has been prescribed is effective.  Contact information to [place] given to pt to follow up for HTN and depression.  Pt reports feeling well and concerns today.

## 2017-08-20 NOTE — Progress Notes (Signed)
Pt not seen by provider.

## 2017-08-20 NOTE — BH Specialist Note (Deleted)
Integrated Behavioral Health Follow Up Visit  MRN: [id number] Name: Kennedie Pardoe   Session Start time: *** Session End time: *** Total time: {IBH Total Time:21014050} Number of Integrated Behavioral Health Clinician visits: {IBH Number of Visits:21014052}  Type of Service: [place] Interpretor:{yes [id number]} Interpretor Name and Language: ***   Warm Hand Off Completed.       SUBJECTIVE: Mariesa Grieder is a 36 y.o. female accompanied by {Persons; PED relatives w/patient:19415}. Patient was referred by *** for ***. Patient reports the following symptoms/concerns: *** Duration of problem: ***; Severity of problem: {Mild/Moderate/Severe:20260}  OBJECTIVE: Mood: {BHH MOOD:22306} and Affect: {BHH AFFECT:22307} Risk of harm to self or others: {CHL AMB BH Suicide Current Mental Status:21022748}   LIFE CONTEXT: Family and Social: *** School/Work: *** Self-Care: *** Life Changes: ***  GOALS ADDRESSED: Patient will reduce symptoms of: {IBH Symptoms:21014056} and increase knowledge and/or ability of: {IBH Patient Tools:21014057} and also: {IBH Goals:21014053}  INTERVENTIONS: {IBH Interventions:21014054} Standardized Assessments completed: {IBH Screening Tools:21014051}  ASSESSMENT: Patient currently experiencing ***. Patient may benefit from ***.  PLAN: 1. Follow up with behavioral health clinician on : *** 2. Behavioral recommendations: *** 3. Referral(s): {IBH Referrals:21014055} 4. "From scale of 1-10, how likely are you to follow plan?": ***  Jamie C McMannes, LCSWA

## 2018-01-08 ENCOUNTER — Emergency Department (HOSPITAL_COMMUNITY)
Admission: EM | Admit: 2018-01-08 | Discharge: 2018-01-08 | Disposition: A | Discharge status: Home / Self Care | Payer: Self-pay | Attending: Emergency Medicine | Admitting: Emergency Medicine

## 2018-01-08 ENCOUNTER — Encounter (HOSPITAL_COMMUNITY): Payer: Self-pay | Admitting: Emergency Medicine

## 2018-01-08 ENCOUNTER — Emergency Department (HOSPITAL_COMMUNITY): Payer: Self-pay

## 2018-01-08 DIAGNOSIS — Z87891 Personal history of nicotine dependence: Secondary | ICD-10-CM | POA: Insufficient documentation

## 2018-01-08 DIAGNOSIS — D649 Anemia, unspecified: Secondary | ICD-10-CM

## 2018-01-08 DIAGNOSIS — R197 Diarrhea, unspecified: Secondary | ICD-10-CM

## 2018-01-08 DIAGNOSIS — D259 Leiomyoma of uterus, unspecified: Secondary | ICD-10-CM

## 2018-01-08 DIAGNOSIS — R1030 Lower abdominal pain, unspecified: Secondary | ICD-10-CM

## 2018-01-08 DIAGNOSIS — Z79899 Other long term (current) drug therapy: Secondary | ICD-10-CM | POA: Insufficient documentation

## 2018-01-08 DIAGNOSIS — N938 Other specified abnormal uterine and vaginal bleeding: Secondary | ICD-10-CM

## 2018-01-08 DIAGNOSIS — R102 Pelvic and perineal pain: Secondary | ICD-10-CM

## 2018-01-08 DIAGNOSIS — I10 Essential (primary) hypertension: Secondary | ICD-10-CM | POA: Insufficient documentation

## 2018-01-08 LAB — RETICULOCYTES
RBC.: 4.31 MIL/uL (ref 3.87–5.11)
RETIC COUNT ABSOLUTE: 90.5 10*3/uL (ref 19.0–186.0)
RETIC CT PCT: 2.1 % (ref 0.4–3.1)

## 2018-01-08 LAB — HEPATIC FUNCTION PANEL
ALK PHOS: 40 U/L (ref 38–126)
ALT: 17 U/L (ref 14–54)
AST: 23 U/L (ref 15–41)
Albumin: 4 g/dL (ref 3.5–5.0)
Bilirubin, Direct: <0.1 mg/dL — ABNORMAL LOW (ref 0.1–0.5)
Total Bilirubin: 0.4 mg/dL (ref 0.3–1.2)
Total Protein: 7.2 g/dL (ref 6.5–8.1)

## 2018-01-08 LAB — URINALYSIS, ROUTINE W REFLEX MICROSCOPIC
BILIRUBIN URINE: NEGATIVE
Glucose, UA: NEGATIVE mg/dL
KETONES UR: NEGATIVE mg/dL
Nitrite: NEGATIVE
PROTEIN: NEGATIVE mg/dL
Specific Gravity, Urine: 1.013 (ref 1.005–1.030)
pH: 6 (ref 5.0–8.0)

## 2018-01-08 LAB — CBC
HEMATOCRIT: 27.3 % — AB (ref 36.0–46.0)
Hemoglobin: 8.5 g/dL — ABNORMAL LOW (ref 12.0–15.0)
MCH: 21.8 pg — ABNORMAL LOW (ref 26.0–34.0)
MCHC: 31.1 g/dL (ref 30.0–36.0)
MCV: 70 fL — ABNORMAL LOW (ref 78.0–100.0)
Platelets: 317 10*3/uL (ref 150–400)
RBC: 3.9 MIL/uL (ref 3.87–5.11)
RDW: 15.2 % (ref 11.5–15.5)
WBC: 6.8 10*3/uL (ref 4.0–10.5)

## 2018-01-08 LAB — BASIC METABOLIC PANEL
ANION GAP: 11 (ref 5–15)
BUN: 12 mg/dL (ref 6–20)
CALCIUM: 8.9 mg/dL (ref 8.9–10.3)
CO2: 19 mmol/L — ABNORMAL LOW (ref 22–32)
Chloride: 104 mmol/L (ref 101–111)
Creatinine, Ser: 1.07 mg/dL — ABNORMAL HIGH (ref 0.44–1.00)
Glucose, Bld: 149 mg/dL — ABNORMAL HIGH (ref 65–99)
Potassium: 3.6 mmol/L (ref 3.5–5.1)
Sodium: 134 mmol/L — ABNORMAL LOW (ref 135–145)

## 2018-01-08 LAB — WET PREP, GENITAL
Clue Cells Wet Prep HPF POC: NONE SEEN
Sperm: NONE SEEN
TRICH WET PREP: NONE SEEN
Yeast Wet Prep HPF POC: NONE SEEN

## 2018-01-08 LAB — POC URINE PREG, ED: PREG TEST UR: NEGATIVE

## 2018-01-08 LAB — IRON AND TIBC
Iron: 16 ug/dL — ABNORMAL LOW (ref 28–170)
Saturation Ratios: 3 % — ABNORMAL LOW (ref 10.4–31.8)
TIBC: 630 ug/dL — ABNORMAL HIGH (ref 250–450)
UIBC: 614 ug/dL

## 2018-01-08 LAB — VITAMIN B12: VITAMIN B 12: 300 pg/mL (ref 180–914)

## 2018-01-08 LAB — FOLATE: FOLATE: 18.9 ng/mL (ref >5.9)

## 2018-01-08 LAB — ABO/RH: ABO/RH(D): O POS

## 2018-01-08 LAB — FERRITIN: Ferritin: 2 ng/mL — ABNORMAL LOW (ref 11–307)

## 2018-01-08 LAB — I-STAT BETA HCG BLOOD, ED (MC, WL, AP ONLY): I-stat hCG, quantitative: 73.2 m[IU]/mL — ABNORMAL HIGH (ref <5)

## 2018-01-08 LAB — TYPE AND SCREEN
ABO/RH(D): O POS
ANTIBODY SCREEN: NEGATIVE

## 2018-01-08 MED ORDER — NORETHINDRONE ACETATE 5 MG PO TABS
5.0000 mg | ORAL_TABLET | Freq: Three times a day (TID) | ORAL | 0 refills | Status: DC
Start: 2018-01-08 — End: 2020-01-10

## 2018-01-08 MED ORDER — FERROUS SULFATE 325 (65 FE) MG PO TABS: 325.0000 mg | ORAL_TABLET | Freq: Two times a day (BID) | ORAL | 2 refills | Status: DC

## 2018-01-08 MED ORDER — MORPHINE SULFATE (PF) 4 MG/ML IV SOLN
4.0000 mg | Freq: Once | INTRAVENOUS | Status: AC
  Administered 2018-01-08: 4 mg via INTRAVENOUS
  Filled 2018-01-08: qty 1

## 2018-01-08 MED ORDER — SODIUM CHLORIDE 0.9 % IV BOLUS (SEPSIS)
1000.0000 mL | Freq: Once | INTRAVENOUS | Status: AC
  Administered 2018-01-08: 1000 mL via INTRAVENOUS

## 2018-01-08 NOTE — ED Notes (Signed)
Pt changing into gown at this time

## 2018-01-08 NOTE — ED Notes (Signed)
ED Provider at bedside. 

## 2018-01-08 NOTE — ED Notes (Signed)
X-Ray tech stated that IV therapy completed.

## 2018-01-08 NOTE — ED Triage Notes (Signed)
Pt presents to ED for assessment after 1+ months of vaginal bleeding with large clots.  Hx of endometriosis and fibroids.  Recently trialed on a new birth control to help with bleeding without relief.  Lower abdominal pain.  Pt states today she felt faint while in the bathroom after a bowel movement.   Denies LOC

## 2018-01-08 NOTE — ED Notes (Signed)
Pt to Ultrasound

## 2018-01-08 NOTE — ED Notes (Signed)
Pt is back from Ultrasound.

## 2018-01-08 NOTE — Discharge Instructions (Signed)
Your lightheadedness is likely from the fact that you're anemic, which is probably from the fact that you've been having dysfunctional uterine bleeding for so long. Start taking iron as directed; if this causes constipation then use over the counter miralax or colace to help with this issue. Increase your norethindrone to 5mg  (1 tablet) ever 8 hours until you're seen by the OBGYN in follow up. Alternate between tylenol and ibuprofen to help with pain. Use heat to your belly to help with pain as well. Allow for pelvic rest, avoiding intercourse or anything into the vagina until your symptoms resolve. Stay well hydrated, get plenty of rest. Follow up with your OBGYN in 5-7 days for recheck of symptoms and ongoing management of your dysfunctional uterine bleeding. Go to the [facility] hospital emergency department (called the MAU) for any changes or worsening symptoms.   For your diarrhea, you may consider using over the counter tums, maalox, pepto bismol, imodium, or other over the counter remedies to help with symptoms. Follow a BRAT (banana-rice-applesauce-toast) diet as described below for the next 24-48 hours. The 'BRAT' diet is suggested, then progress to diet as tolerated as symptoms abate. Call your regular doctor if bloody stools, persistent diarrhea, vomiting, fever or abdominal pain.   Also, you have been tested for gonorrhea, chlamydia, HIV, and Syphilis, and the hospital will call you if the lab is positive. DO NOT ENGAGE IN SEXUAL ACTIVITY UNTIL YOU FIND OUT ABOUT YOUR RESULTS AND HAVE PARTNERS TESTED AND TREATED. ALL PARTNERS MUST BE TESTED AND TREATED FOR STD'S. ALWAYS USE CONDOMS WHEN ENGAGING IN INTERCOURSE. Follow up with [facility] Department STD clinic for future STD concerns or screenings, treatment, etc.

## 2018-01-08 NOTE — ED Notes (Signed)
Patient transported to Ultrasound 

## 2018-01-08 NOTE — ED Provider Notes (Signed)
[facility] Provider Note   CSN: [id number] Arrival date & time: 01/08/18  1648     History   Chief Complaint Chief Complaint  Patient presents with  . Vaginal Bleeding    HPI Diana Butler is a 37 y.o. female with a PMHx of endometriosis, uterine fibroids, menorrhagia, HTN, and depression, with PSHx of b/l salpingectomy for ectopic pregnancies on both sides, who presents to the ED with complaints of lower abdominal pain that began about 2 hours ago while having a bowel movement around 4 PM.  Patient describes the pain as 10/10 crampy intermittent nonradiating suprapubic abdominal pain which worsens with having a bowel movement and with no treatments tried prior to arrival.  She reports that she had 3 episodes of nonbloody looser than normal diarrhea but denies any watery stools.  She states that when she was on the toilet having a bowel movement she began feeling lightheaded, this worsened when she tried to stand up and improves when she lays down.  Lastly she mentions that she has had vaginal bleeding for the last 4 months, has been seen by Dr. Ilda Basset of OB/GYN who has prescribed norethindrone 5mg  daily which she is taking but states that it has not helped.  She reports that the vaginal bleeding is very heavy, dark red blood with clots, going through about 4 pads per day.  She has not been sexually active in the last 4 months.  She does not have a PCP, she only sees OB/GYN at this time.  She admits that she drank a 778ml bottle of wine 2 days ago.  She denies fevers, chills, CP, SOB, nausea/vomiting, constipation, obstipation, melena, hematochezia, hematuria, dysuria, vaginal itching/discharge, genital sores, myalgias, arthralgias, numbness, tingling, focal weakness, or any other complaints at this time. Denies recent travel, sick contacts, suspicious food intake, frequent NSAID use, or recent abx.    The history is provided by the patient and medical  records. No language interpreter was used.  Vaginal Bleeding  Primary symptoms include vaginal bleeding.  Primary symptoms include no dysuria. Associated symptoms include abdominal pain, diarrhea and light-headedness. Pertinent negatives include no constipation, no nausea and no vomiting.  Abdominal Pain   This is a new problem. The current episode started 1 to 2 hours ago. Episode frequency: intermittently. The problem has not changed since onset.The pain is associated with an unknown factor. The pain is located in the suprapubic region. The quality of the pain is cramping. The pain is at a severity of 10/10. The pain is moderate. Associated symptoms include diarrhea. Pertinent negatives include fever, flatus, hematochezia, melena, nausea, vomiting, constipation, dysuria, hematuria, arthralgias and myalgias. The symptoms are aggravated by bowel movements. Nothing relieves the symptoms.    Past Medical History:  Diagnosis Date  . Endometriosis    per patient report.   . Hypertension     Patient Active Problem List   Diagnosis Date Noted  . Fibroid 07/22/2017  . Dysmenorrhea 07/22/2017  . Tobacco abuse 07/22/2017  . Depression 07/22/2017  . Menorrhagia with regular cycle 07/22/2017  . Leucocytosis 07/22/2017    Past Surgical History:  Procedure Laterality Date  . SALPINGECTOMY     right, ectopic. pt states had l/s and ex-lap  . SALPINGECTOMY     left, ectopic. pt states had l/s and ex-lap  . TONSILLECTOMY     age 67    OB History    Gravida Para Term Preterm AB Living   2  2 0   SAB TAB Ectopic Multiple Live Births       2           Home Medications    Prior to Admission medications   Medication Sig Start Date End Date Taking? Authorizing Provider  FLUoxetine (PROZAC) 10 MG capsule One tab po qday x1wk. Then two tabs po qday thereafter. 07/22/17   Aletha Halim, MD  ibuprofen (ADVIL,MOTRIN) 200 MG tablet 600mg  po q6h for two days once your period starts and then  q6h as needed for pain 07/22/17   Aletha Halim, MD  ketorolac (TORADOL) 10 MG tablet Take 1 tablet (10 mg total) by mouth every 6 (six) hours as needed for severe pain. 07/22/17   Aletha Halim, MD  norethindrone (AYGESTIN) 5 MG tablet Take 1 tablet (5 mg total) by mouth daily. 07/22/17   Aletha Halim, MD  oxyCODONE-acetaminophen (PERCOCET/ROXICET) 5-325 MG tablet Take 1-2 tablets by mouth every 6 (six) hours as needed. 06/18/17   Jorje Guild, NP    Family History History reviewed. No pertinent family history.  Social History Social History   Tobacco Use  . Smoking status: Former Smoker    Packs/day: 0.25    Years: 10.00    Pack years: 2.50    Types: Cigarettes    Last attempt to quit: 06/21/2017    Years since quitting: 0.5  . Smokeless tobacco: Never Used  Substance Use Topics  . Alcohol use: Yes    Comment: occ  . Drug use: No     Allergies   Patient has no known allergies.   Review of Systems Review of Systems  Constitutional: Negative for chills and fever.  Respiratory: Negative for shortness of breath.   Cardiovascular: Negative for chest pain.  Gastrointestinal: Positive for abdominal pain and diarrhea. Negative for blood in stool, constipation, flatus, hematochezia, melena, nausea and vomiting.  Genitourinary: Positive for vaginal bleeding. Negative for dysuria, genital sores, hematuria, vaginal discharge and vaginal pain.  Musculoskeletal: Negative for arthralgias and myalgias.  Skin: Negative for color change.  Allergic/Immunologic: Negative for immunocompromised state.  Neurological: Positive for light-headedness. Negative for weakness and numbness.  Psychiatric/Behavioral: Negative for confusion.   All other systems reviewed and are negative for acute change except as noted in the HPI.    Physical Exam Updated Vital Signs BP 112/71 (BP Location: Left Arm)   Pulse 66   Temp 98.3 F (36.8 C) (Oral)   Resp 16   SpO2 100%   Physical Exam    Constitutional: She is oriented to person, place, and time. Vital signs are normal. She appears well-developed and well-nourished.  Non-toxic appearance. No distress.  Afebrile, nontoxic, NAD  HENT:  Head: Normocephalic and atraumatic.  Mouth/Throat: Oropharynx is clear and moist and mucous membranes are normal.  Eyes: Conjunctivae and EOM are normal. Right eye exhibits no discharge. Left eye exhibits no discharge.  Mildly pale conjunctiva  Neck: Normal range of motion. Neck supple.  Cardiovascular: Normal rate, regular rhythm, normal heart sounds and intact distal pulses. Exam reveals no gallop and no friction rub.  No murmur heard. Pulmonary/Chest: Effort normal and breath sounds normal. No respiratory distress. She has no decreased breath sounds. She has no wheezes. She has no rhonchi. She has no rales.  Abdominal: Soft. Normal appearance and bowel sounds are normal. She exhibits no distension. There is tenderness in the right lower quadrant, suprapubic area and left lower quadrant. There is no rigidity, no rebound, no guarding, no CVA tenderness, no tenderness  at McBurney's point and negative Murphy's sign.  Soft, nondistended, +BS throughout, with mild lower abd TTP across pelvic brim, no r/g/r, neg murphy's, neg mcburney's, no CVA TTP   Genitourinary: Pelvic exam was performed with patient supine. There is no rash, tenderness or lesion on the right labia. There is no rash, tenderness or lesion on the left labia. Uterus is enlarged. Uterus is not deviated, not fixed and not tender. Cervix exhibits no motion tenderness, no discharge and no friability. Right adnexum displays no mass, no tenderness and no fullness. Left adnexum displays tenderness. Left adnexum displays no mass and no fullness. There is bleeding in the vagina. No erythema or tenderness in the vagina. No vaginal discharge found.  Genitourinary Comments: Chaperone present for exam. No rashes, lesions, or tenderness to external  genitalia. No erythema, injury, or tenderness to vaginal mucosa. No vaginal discharge within vaginal vault, mild amount of red blood in vaginal vault which appears to be from cervix. No active hemorrhage or persistent bleeding from os. No adnexal masses or fullness, mild L adnexal tenderness. No CMT, cervical friability, or discharge from cervical os. Cervical os is closed. Uterus non-deviated, mobile, nonTTP, but with mild enlargement (?fibroid midline).   Musculoskeletal: Normal range of motion.  Neurological: She is alert and oriented to person, place, and time. She has normal strength. No sensory deficit.  Skin: Skin is warm, dry and intact. No rash noted.  Psychiatric: She has a normal mood and affect.  Nursing note and vitals reviewed.    ED Treatments / Results  Labs (all labs ordered are listed, but only abnormal results are displayed) Labs Reviewed  WET PREP, GENITAL - Abnormal; Notable for the following components:      Result Value   WBC, Wet Prep HPF POC RARE (*)    All other components within normal limits  CBC - Abnormal; Notable for the following components:   Hemoglobin 8.5 (*)    HCT 27.3 (*)    MCV 70.0 (*)    MCH 21.8 (*)    All other components within normal limits  BASIC METABOLIC PANEL - Abnormal; Notable for the following components:   Sodium 134 (*)    CO2 19 (*)    Glucose, Bld 149 (*)    Creatinine, Ser 1.07 (*)    All other components within normal limits  HEPATIC FUNCTION PANEL - Abnormal; Notable for the following components:   Bilirubin, Direct <0.1 (*)    All other components within normal limits  URINALYSIS, ROUTINE W REFLEX MICROSCOPIC - Abnormal; Notable for the following components:   Hgb urine dipstick LARGE (*)    Leukocytes, UA TRACE (*)    Bacteria, UA RARE (*)    Squamous Epithelial / LPF 0-5 (*)    All other components within normal limits  I-STAT BETA HCG BLOOD, ED ([facility], [facility], [facility] ONLY) - Abnormal; Notable for the following components:    I-stat hCG, quantitative 73.2 (*)    All other components within normal limits  RETICULOCYTES  RPR  HIV ANTIBODY (ROUTINE TESTING)  VITAMIN B12  FOLATE  IRON AND TIBC  FERRITIN  POC URINE PREG, ED  TYPE AND SCREEN  ABO/RH  GC/CHLAMYDIA PROBE AMP ([place]) NOT AT [facility]    EKG  EKG Interpretation  Date/Time:  Saturday January 08 2018 18:46:47 EST Ventricular Rate:  66 PR Interval:    QRS Duration: 82 QT Interval:  409 QTC Calculation: 429 R Axis:   24 Text Interpretation:  Sinus rhythm No  significant change since last tracing Confirmed by Orlie Dakin [phone]) on 01/08/2018 6:52:08 PM       Radiology US Pelvis Transvanginal Non-ob (tv Only)  Result Date: 01/08/2018 CLINICAL DATA:  Pelvic pain today. Dysfunctional uterine bleeding for the past 5 months. Two previous ectopic pregnancies. EXAM: TRANSABDOMINAL AND TRANSVAGINAL ULTRASOUND OF PELVIS DOPPLER ULTRASOUND OF OVARIES TECHNIQUE: Both transabdominal and transvaginal ultrasound examinations of the pelvis were performed. Transabdominal technique was performed for global imaging of the pelvis including uterus, ovaries, adnexal regions, and pelvic cul-de-sac. It was necessary to proceed with endovaginal exam following the transabdominal exam to visualize the ovaries, uterus and endometrium in better detail. Color and duplex Doppler ultrasound was utilized to evaluate blood flow to the ovaries. COMPARISON:  Pelvic ultrasound dated 06/17/2017 and pelvic CT dated 06/18/2017. FINDINGS: Uterus Measurements: 7.6 x 4.7 x 4.6 cm. The previously demonstrated 2.8 x 2.2 x 1.8 cm mildly eccentric central uterine mass currently measures 2.6 x 2.2 x 1.7 cm. This continues to be within or just beneath the endometrium. There is an additional 1.7 x 1.7 x 1.6 cm fundal myometrial mass. This is peripherally located without a submucosal component. This was not previously measured. There is an additional 1.1 x 1.1 x 0.9 cm similar-appearing peripheral  myometrial mass anteriorly, previously 0.6 x 0.6 x 0.6 cm. Endometrium Thickness: 11.7 mm. Not well visualized separate from the central uterine mass. Right ovary Measurements: 2.9 x 1.9 x 1.8 cm. Normal appearance/no adnexal mass. Left ovary Measurements: 3.4 x 2.8 x 1.8 cm. Normal appearance/no adnexal mass. Pulsed Doppler evaluation of both ovaries demonstrates normal low-resistance arterial and venous waveforms. Other findings No abnormal free fluid. IMPRESSION: 1. No significant change in a probable central submucosal uterine fibroid measuring 2.6 cm in maximum diameter today. Again, an endometrial polyp is less likely but not excluded. 2. Mild increase in size of 2 additional peripheral myometrial fibroids. 3. No acute abnormality. Electronically Signed   By: Claudie Revering M.D.   On: 01/08/2018 20:48   US Pelvis (transabdominal Only)  Result Date: 01/08/2018 CLINICAL DATA:  Pelvic pain today. Dysfunctional uterine bleeding for the past 5 months. Two previous ectopic pregnancies. EXAM: TRANSABDOMINAL AND TRANSVAGINAL ULTRASOUND OF PELVIS DOPPLER ULTRASOUND OF OVARIES TECHNIQUE: Both transabdominal and transvaginal ultrasound examinations of the pelvis were performed. Transabdominal technique was performed for global imaging of the pelvis including uterus, ovaries, adnexal regions, and pelvic cul-de-sac. It was necessary to proceed with endovaginal exam following the transabdominal exam to visualize the ovaries, uterus and endometrium in better detail. Color and duplex Doppler ultrasound was utilized to evaluate blood flow to the ovaries. COMPARISON:  Pelvic ultrasound dated 06/17/2017 and pelvic CT dated 06/18/2017. FINDINGS: Uterus Measurements: 7.6 x 4.7 x 4.6 cm. The previously demonstrated 2.8 x 2.2 x 1.8 cm mildly eccentric central uterine mass currently measures 2.6 x 2.2 x 1.7 cm. This continues to be within or just beneath the endometrium. There is an additional 1.7 x 1.7 x 1.6 cm fundal myometrial  mass. This is peripherally located without a submucosal component. This was not previously measured. There is an additional 1.1 x 1.1 x 0.9 cm similar-appearing peripheral myometrial mass anteriorly, previously 0.6 x 0.6 x 0.6 cm. Endometrium Thickness: 11.7 mm. Not well visualized separate from the central uterine mass. Right ovary Measurements: 2.9 x 1.9 x 1.8 cm. Normal appearance/no adnexal mass. Left ovary Measurements: 3.4 x 2.8 x 1.8 cm. Normal appearance/no adnexal mass. Pulsed Doppler evaluation of both ovaries demonstrates normal low-resistance arterial and venous  waveforms. Other findings No abnormal free fluid. IMPRESSION: 1. No significant change in a probable central submucosal uterine fibroid measuring 2.6 cm in maximum diameter today. Again, an endometrial polyp is less likely but not excluded. 2. Mild increase in size of 2 additional peripheral myometrial fibroids. 3. No acute abnormality. Electronically Signed   By: Claudie Revering M.D.   On: 01/08/2018 20:48   US Pelvic Doppler (torsion R/o Or Mass Arterial Flow)  Result Date: 01/08/2018 CLINICAL DATA:  Pelvic pain today. Dysfunctional uterine bleeding for the past 5 months. Two previous ectopic pregnancies. EXAM: TRANSABDOMINAL AND TRANSVAGINAL ULTRASOUND OF PELVIS DOPPLER ULTRASOUND OF OVARIES TECHNIQUE: Both transabdominal and transvaginal ultrasound examinations of the pelvis were performed. Transabdominal technique was performed for global imaging of the pelvis including uterus, ovaries, adnexal regions, and pelvic cul-de-sac. It was necessary to proceed with endovaginal exam following the transabdominal exam to visualize the ovaries, uterus and endometrium in better detail. Color and duplex Doppler ultrasound was utilized to evaluate blood flow to the ovaries. COMPARISON:  Pelvic ultrasound dated 06/17/2017 and pelvic CT dated 06/18/2017. FINDINGS: Uterus Measurements: 7.6 x 4.7 x 4.6 cm. The previously demonstrated 2.8 x 2.2 x 1.8 cm mildly  eccentric central uterine mass currently measures 2.6 x 2.2 x 1.7 cm. This continues to be within or just beneath the endometrium. There is an additional 1.7 x 1.7 x 1.6 cm fundal myometrial mass. This is peripherally located without a submucosal component. This was not previously measured. There is an additional 1.1 x 1.1 x 0.9 cm similar-appearing peripheral myometrial mass anteriorly, previously 0.6 x 0.6 x 0.6 cm. Endometrium Thickness: 11.7 mm. Not well visualized separate from the central uterine mass. Right ovary Measurements: 2.9 x 1.9 x 1.8 cm. Normal appearance/no adnexal mass. Left ovary Measurements: 3.4 x 2.8 x 1.8 cm. Normal appearance/no adnexal mass. Pulsed Doppler evaluation of both ovaries demonstrates normal low-resistance arterial and venous waveforms. Other findings No abnormal free fluid. IMPRESSION: 1. No significant change in a probable central submucosal uterine fibroid measuring 2.6 cm in maximum diameter today. Again, an endometrial polyp is less likely but not excluded. 2. Mild increase in size of 2 additional peripheral myometrial fibroids. 3. No acute abnormality. Electronically Signed   By: Claudie Revering M.D.   On: 01/08/2018 20:48    Procedures Procedures (including critical care time)  Medications Ordered in ED Medications  sodium chloride 0.9 % bolus 1,000 mL (1,000 mLs Intravenous New Bag/Given 01/08/18 1841)  morphine 4 MG/ML injection 4 mg (4 mg Intravenous Given 01/08/18 1916)     Initial Impression / Assessment and Plan / ED Course  I have reviewed the triage vital signs and the nursing notes.  Pertinent labs & imaging results that were available during my care of the patient were reviewed by me and considered in my medical decision making (see chart for details).     37 y.o. female here with lower abd cramping and diarrhea x2hrs, and lightheadedness. Has also had vaginal bleeding x4 months, is on norethindrone but states that's not helping. On exam, mild lower  abd TTP along pelvic brim, nonperitoneal; afebrile and nontoxic appearing. Mildly pale conjunctiva. VSS. Work up thus far reveals: CBC with Hgb 8.5/Hct 27.3, MCV 70, which is a new finding compared to 06/2017, likely related to her chronic blood loss from vaginal bleeding. Istat BetaHCG 73.2, pt has not been sexually active in 5 months and is on a hormone contraceptive pill, doubt this is actually accurate; likely false positive, will get  Upreg to confirm falseness. Awaiting BMP. Will perform pelvic exam, and add-on: LFTs, U/A, Upreg, anemia panel, EKG, and wet prep. Will proceed with pelvic exam and then decide on if any imaging is necessary. Pt declines wanting anything for pain. Will give fluids and reassess shortly.   7:00 PM BMP with mildly elevated Cr 1.07, and mildly elevated gluc 149, but otherwise fairly unremarkable. EKG unremarkable. Pelvic exam reveals mild amount of red blood in vaginal vault, coming from cervix, no ongoing hemorrhage or active bleeding; mild L adnexal TTP, ?fibroids with mild uterine enlargement but doesn't feel gravid. Will proceed with pelvic U/S. Pt will provide urine specimen now, and hopefully the Upreg will confirm that she's not in fact pregnant. Will reassess shortly.   9:21 PM LFTs unremarkable. Retics WNL, remainder of anemia panel pending, but I anticipate it will show iron deficiency anemia likely from chronic blood loss. U/A with blood likely from vaginal contaminant, but no evidence of UTI (rare bacteria, 0-5 squamous and WBCs, nitrite neg, trace leuks). Upreg negative which confirms the fact that her Francis Gaines was a false positive. Wet prep with rare WBCs but otherwise unremarkable, doubt need for empiric STD treatment given lack of clinical findings. Pelvic U/S again demonstrates central submucosal uterine fibroid, and mild increase in size of two additional peripheral myometrial fibroids but otherwise no acute findings.  Doubt need for emergent transfusion  at this time, as this has likely developed gradually and her vital signs are stable, however discussed with OBGYN consultant Dr. Ilda Basset who recommended to increase her norethindrone to 5mg  TID to help stop the bleeding and f/up in clinic for ongoing management. Will start on iron supplementation BID as instructed by OBGYN; discussed OTC stool softeners if constipation occurs from that. Pelvic rest advised. Advised use of tylenol/motrin/heat for pain. Discussed adequate hydration, and BRAT diet for diarrhea. F/up with OBGYN in 5-7 days for recheck of symptoms and ongoing management of her ongoing dysfunctional uterine bleeding. Also discussed abstinence until STD results are back, and health dept f/up for any future STD concerns/treatment/testing/etc, having all sexual partners tested and treated as well. I explained the diagnosis and have given explicit precautions to return to the ER including for any other new or worsening symptoms. The patient understands and accepts the medical plan as it's been dictated and I have answered their questions. Discharge instructions concerning home care and prescriptions have been given. The patient is STABLE and is discharged to home in good condition.    Final Clinical Impressions(s) / ED Diagnoses   Final diagnoses:  Pelvic pain  Dysfunctional uterine bleeding  Diarrhea, unspecified type  Lower abdominal pain  Symptomatic anemia  Uterine leiomyoma, unspecified location    ED Discharge Orders        Ordered    ferrous sulfate 325 (65 FE) MG tablet  2 times daily with meals     01/08/18 2115    norethindrone (AYGESTIN) 5 MG tablet  Every 8 hours     01/08/18 2115       Teria Khachatryan, [place], [place] 01/08/18 2122    Orlie Dakin, MD 01/09/18 0010

## 2018-01-09 LAB — RPR: RPR Ser Ql: NONREACTIVE

## 2018-01-09 LAB — HIV ANTIBODY (ROUTINE TESTING W REFLEX): HIV Screen 4th Generation wRfx: NONREACTIVE

## 2018-01-09 NOTE — ED Provider Notes (Signed)
Patient with vaginal bleeding and with lower abdominal pain for the past 4 months, became lightheaded today.  On exam patient is alert and in no distress lungs clear to auscultation heart regular rate and rhythm abdomen nondistended nontender   Orlie Dakin, MD 01/09/18 0009

## 2018-01-10 LAB — GC/CHLAMYDIA PROBE AMP (~~LOC~~) NOT AT ARMC
Chlamydia: NEGATIVE
Neisseria Gonorrhea: NEGATIVE

## 2018-02-21 ENCOUNTER — Encounter: Payer: Self-pay | Admitting: Obstetrics and Gynecology

## 2018-02-21 ENCOUNTER — Ambulatory Visit (INDEPENDENT_AMBULATORY_CARE_PROVIDER_SITE_OTHER): Payer: Self-pay | Admitting: Obstetrics and Gynecology

## 2018-02-21 VITALS — BP 139/89 | HR 72 | Ht <= 58 in | Wt 163.8 lb

## 2018-02-21 DIAGNOSIS — N939 Abnormal uterine and vaginal bleeding, unspecified: Secondary | ICD-10-CM

## 2018-02-21 DIAGNOSIS — D649 Anemia, unspecified: Secondary | ICD-10-CM | POA: Insufficient documentation

## 2018-02-21 DIAGNOSIS — D219 Benign neoplasm of connective and other soft tissue, unspecified: Secondary | ICD-10-CM

## 2018-02-21 MED ORDER — MEDROXYPROGESTERONE ACETATE 10 MG PO TABS: ORAL_TABLET | ORAL | 1 refills | Status: DC

## 2018-02-21 NOTE — Patient Instructions (Signed)
For free pap smear call [phone] to schedule or [email or web address]

## 2018-02-21 NOTE — Progress Notes (Signed)
Obstetrics and Gynecology Established Patient Evaluation  Appointment Date: 02/21/2018  OBGYN Clinic: [facility]  Primary Care Provider: Patient, No Pcp Per  Referring Provider: [facility] ED  Chief Complaint: ED f/u  History of Present Illness: Diana Butler is a 37 y.o. African-American [id number] (LMP: approx 1wk after her 2/2 ED visit ), seen for the above chief complaint.  PMHx significant for HTN, h/o BTL and patient endorsed endo.   Patient went to [facility] ED on 01/08/2018 with VB and pain. U/s showed still present fibroids and she had anemia. She was discharged to home on iron and po progestin. She was last seen by me in 07/2017 for the same reason. Patient does not have insurance coverage and while she was trying to apply for assistance, but was put on Aygestin in the mean time. She states that she took the medications but had AUB all the time while on it and the AUB didn't stop until she went to the ED and stopped taking it all together. Pt states she had a period about a week after leaving the ED and it wasn't too heavy or painful.   She currently denies any pain or bleeding.   Review of Systems:  as noted in the History of Present Illness.  Past Medical History:  Past Medical History:  Diagnosis Date  . Endometriosis    per patient report.   . Hypertension     Past Surgical History:  Past Surgical History:  Procedure Laterality Date  . SALPINGECTOMY     right, ectopic. pt states had l/s and ex-lap  . SALPINGECTOMY     left, ectopic. pt states had l/s and ex-lap  . TONSILLECTOMY     age 41    Past Obstetrical History:  OB History  Gravida Para Term Preterm AB Living  2       2 0  SAB TAB Ectopic Multiple Live Births      2        # Outcome Date GA Lbr Len/2nd Weight Sex Delivery Anes PTL Lv  2 Ectopic           1 Ectopic               Past Gynecological History: As per HPI. qmonth periods, regular, lasts for 5-6d History of  Pap Smear(s): Yes.   Last pap 2017, which was negative per patient History of STI(s): Yes.   She is currently using h/o bilateral salpingectomy for contraception.  Social History:  Social History   Socioeconomic History  . Marital status: Single    Spouse name: Not on file  . Number of children: Not on file  . Years of education: Not on file  . Highest education level: Not on file  Social Needs  . Financial resource strain: Not on file  . Food insecurity - worry: Not on file  . Food insecurity - inability: Not on file  . Transportation needs - medical: Not on file  . Transportation needs - non-medical: Not on file  Occupational History  . Not on file  Tobacco Use  . Smoking status: Former Smoker    Packs/day: 0.25    Years: 10.00    Pack years: 2.50    Types: Cigarettes    Last attempt to quit: 06/21/2017    Years since quitting: 0.6  . Smokeless tobacco: Never Used  Substance and Sexual Activity  . Alcohol use: Yes    Comment: occ  . Drug use:  No  . Sexual activity: Yes    Birth control/protection: Surgical  Other Topics Concern  . Not on file  Social History Narrative  . Not on file    Family History:  She denies any female cancers, bleeding or blood clotting disorders.    Medications Diana Butler had no medications administered during this visit. Current Outpatient Medications  Medication Sig Dispense Refill  . ferrous sulfate 325 (65 FE) MG tablet Take 1 tablet (325 mg total) by mouth 2 (two) times daily with a meal. TAKE WITH ORANGE JUICE 60 tablet 2  . ibuprofen (ADVIL,MOTRIN) 200 MG tablet 600mg  po q6h for two days once your period starts and then q6h as needed for pain 30 tablet 0   No current facility-administered medications for this visit.     Allergies Patient has no known allergies.   Physical Exam:  BP 139/89   Pulse 72   Ht (!) 5.1" (0.13 m)   Wt 163 lb 12.8 oz (74.3 kg)   BMI 4427.68 kg/m  Body mass index is 4,427.68 kg/m. General  appearance: Well nourished, well developed female in no acute distress.   Laboratory:  CBC    Component Value Date/Time   WBC 6.8 01/08/2018 1710   RBC 4.31 01/08/2018 1829   RBC 3.90 01/08/2018 1710   HGB 8.5 (L) 01/08/2018 1710   HCT 27.3 (L) 01/08/2018 1710   PLT 317 01/08/2018 1710   MCV 70.0 (L) 01/08/2018 1710   MCH 21.8 (L) 01/08/2018 1710   MCHC 31.1 01/08/2018 1710   RDW 15.2 01/08/2018 1710   LYMPHSABS 2.1 06/17/2017 1854   MONOABS 0.3 06/17/2017 1854   EOSABS 0.0 06/17/2017 1854   BASOSABS 0.0 06/17/2017 1854   Negative: hepatic function panel, hiv, wet prep, gc/ct Anemia panel c/w iron deficiency  Radiology:  CLINICAL DATA:  Pelvic pain today. Dysfunctional uterine bleeding for the past 5 months. Two previous ectopic pregnancies.  EXAM: TRANSABDOMINAL AND TRANSVAGINAL ULTRASOUND OF PELVIS  DOPPLER ULTRASOUND OF OVARIES  TECHNIQUE: Both transabdominal and transvaginal ultrasound examinations of the pelvis were performed. Transabdominal technique was performed for global imaging of the pelvis including uterus, ovaries, adnexal regions, and pelvic cul-de-sac.  It was necessary to proceed with endovaginal exam following the transabdominal exam to visualize the ovaries, uterus and endometrium in better detail. Color and duplex Doppler ultrasound was utilized to evaluate blood flow to the ovaries.  COMPARISON:  Pelvic ultrasound dated 06/17/2017 and pelvic CT dated 06/18/2017.  FINDINGS: Uterus  Measurements: 7.6 x 4.7 x 4.6 cm. The previously demonstrated 2.8 x 2.2 x 1.8 cm mildly eccentric central uterine mass currently measures 2.6 x 2.2 x 1.7 cm. This continues to be within or just beneath the endometrium.  There is an additional 1.7 x 1.7 x 1.6 cm fundal myometrial mass. This is peripherally located without a submucosal component. This was not previously measured.  There is an additional 1.1 x 1.1 x 0.9 cm similar-appearing peripheral  myometrial mass anteriorly, previously 0.6 x 0.6 x 0.6 cm.  Endometrium  Thickness: 11.7 mm. Not well visualized separate from the central uterine mass.  Right ovary  Measurements: 2.9 x 1.9 x 1.8 cm. Normal appearance/no adnexal mass.  Left ovary  Measurements: 3.4 x 2.8 x 1.8 cm. Normal appearance/no adnexal mass.  Pulsed Doppler evaluation of both ovaries demonstrates normal low-resistance arterial and venous waveforms.  Other findings  No abnormal free fluid.  IMPRESSION: 1. No significant change in a probable central submucosal uterine fibroid measuring 2.6 cm  in maximum diameter today. Again, an endometrial polyp is less likely but not excluded. 2. Mild increase in size of 2 additional peripheral myometrial fibroids. 3. No acute abnormality.   Electronically Signed   By: Claudie Revering M.D.   On: 01/08/2018 20:48  Assessment: pt stable  Plan: D/w her s/s that would make a fibroid symptomatic. I told her that progestins can make AUB worse in submucosal fibroids. Based on her fibroid locations, I told her that surgery is likely the best option to treat them, but I told her that given that her last period, off pills, was fine, that if her subsequent ones are causing her issues then there's not really anything to do surgery or medicine-wise. I told her that only time will tell over the next few months with her following periods, but that if they are symptomatic, as I described to her (heavy/painful bleeding impacting her daily life, bulk s/s, etc) that I recommend some intervention be done. Pt to continue to apply for assistance programs. I gave her prescription for provera and told her that if her bleeding is heavy/painful to the point of she thinks she needs to go to the ED, to try the Rx to see if that helps to slow it down.   RTC PRN  Durene Romans MD Attending [facility])    Periods can sometimes get worse  with a a submucosal fibroid with progestins Trying to apply for coverage PRN provera

## 2018-07-11 ENCOUNTER — Other Ambulatory Visit: Payer: Self-pay | Admitting: *Deleted

## 2018-07-11 NOTE — Telephone Encounter (Signed)
Received a fax for a refill of naproxen 500mg  . Will forward to Dr.

## 2018-07-13 MED ORDER — NAPROXEN 500 MG PO TABS: 500.0000 mg | ORAL_TABLET | Freq: Two times a day (BID) | ORAL | 2 refills | Status: DC

## 2019-09-12 ENCOUNTER — Other Ambulatory Visit: Payer: Self-pay | Admitting: *Deleted

## 2019-09-12 MED ORDER — NAPROXEN 500 MG PO TABS: 500.0000 mg | ORAL_TABLET | Freq: Two times a day (BID) | ORAL | 2 refills | Status: DC

## 2019-09-12 NOTE — Telephone Encounter (Signed)
Received a fax for refill of naproxen. Will route to provider. Fannie Gathright,RN

## 2019-10-18 ENCOUNTER — Emergency Department (HOSPITAL_COMMUNITY): Payer: Self-pay

## 2019-10-18 ENCOUNTER — Encounter (HOSPITAL_COMMUNITY): Payer: Self-pay | Admitting: Emergency Medicine

## 2019-10-18 ENCOUNTER — Emergency Department (HOSPITAL_COMMUNITY)
Admission: EM | Admit: 2019-10-18 | Discharge: 2019-10-18 | Disposition: A | Discharge status: Home / Self Care | Payer: Self-pay | Attending: Emergency Medicine | Admitting: Emergency Medicine

## 2019-10-18 ENCOUNTER — Other Ambulatory Visit: Payer: Self-pay

## 2019-10-18 DIAGNOSIS — Z79899 Other long term (current) drug therapy: Secondary | ICD-10-CM | POA: Insufficient documentation

## 2019-10-18 DIAGNOSIS — Z87891 Personal history of nicotine dependence: Secondary | ICD-10-CM | POA: Insufficient documentation

## 2019-10-18 DIAGNOSIS — R112 Nausea with vomiting, unspecified: Secondary | ICD-10-CM | POA: Insufficient documentation

## 2019-10-18 DIAGNOSIS — N760 Acute vaginitis: Secondary | ICD-10-CM | POA: Insufficient documentation

## 2019-10-18 DIAGNOSIS — R102 Pelvic and perineal pain: Secondary | ICD-10-CM | POA: Insufficient documentation

## 2019-10-18 DIAGNOSIS — I10 Essential (primary) hypertension: Secondary | ICD-10-CM | POA: Insufficient documentation

## 2019-10-18 DIAGNOSIS — R1031 Right lower quadrant pain: Secondary | ICD-10-CM | POA: Insufficient documentation

## 2019-10-18 DIAGNOSIS — N939 Abnormal uterine and vaginal bleeding, unspecified: Secondary | ICD-10-CM | POA: Insufficient documentation

## 2019-10-18 DIAGNOSIS — B9689 Other specified bacterial agents as the cause of diseases classified elsewhere: Secondary | ICD-10-CM

## 2019-10-18 LAB — CBC
HCT: 41.5 % (ref 36.0–46.0)
Hemoglobin: 12.8 g/dL (ref 12.0–15.0)
MCH: 24.5 pg — ABNORMAL LOW (ref 26.0–34.0)
MCHC: 30.8 g/dL (ref 30.0–36.0)
MCV: 79.5 fL — ABNORMAL LOW (ref 80.0–100.0)
Platelets: 275 10*3/uL (ref 150–400)
RBC: 5.22 MIL/uL — ABNORMAL HIGH (ref 3.87–5.11)
RDW: 15.8 % — ABNORMAL HIGH (ref 11.5–15.5)
WBC: 12.4 10*3/uL — ABNORMAL HIGH (ref 4.0–10.5)
nRBC: 0 % (ref 0.0–0.2)

## 2019-10-18 LAB — URINALYSIS, ROUTINE W REFLEX MICROSCOPIC: RBC / HPF: >50 RBC/hpf — ABNORMAL HIGH (ref 0–5)

## 2019-10-18 LAB — COMPREHENSIVE METABOLIC PANEL
ALT: 16 U/L (ref 0–44)
AST: 18 U/L (ref 15–41)
Albumin: 4.7 g/dL (ref 3.5–5.0)
Alkaline Phosphatase: 56 U/L (ref 38–126)
Anion gap: 8 (ref 5–15)
BUN: 12 mg/dL (ref 6–20)
CO2: 24 mmol/L (ref 22–32)
Calcium: 9.5 mg/dL (ref 8.9–10.3)
Chloride: 106 mmol/L (ref 98–111)
Creatinine, Ser: 0.91 mg/dL (ref 0.44–1.00)
GFR calc Af Amer: >60 mL/min (ref >60)
GFR calc non Af Amer: >60 mL/min (ref >60)
Glucose, Bld: 110 mg/dL — ABNORMAL HIGH (ref 70–99)
Potassium: 3.4 mmol/L — ABNORMAL LOW (ref 3.5–5.1)
Sodium: 138 mmol/L (ref 135–145)
Total Bilirubin: 1 mg/dL (ref 0.3–1.2)
Total Protein: 8.3 g/dL — ABNORMAL HIGH (ref 6.5–8.1)

## 2019-10-18 LAB — WET PREP, GENITAL
Sperm: NONE SEEN
Trich, Wet Prep: NONE SEEN
Yeast Wet Prep HPF POC: NONE SEEN

## 2019-10-18 LAB — I-STAT BETA HCG BLOOD, ED (MC, WL, AP ONLY): I-stat hCG, quantitative: <5 m[IU]/mL (ref <5)

## 2019-10-18 LAB — LIPASE, BLOOD: Lipase: 28 U/L (ref 11–51)

## 2019-10-18 MED ORDER — METRONIDAZOLE 500 MG PO TABS: 500.0000 mg | ORAL_TABLET | Freq: Two times a day (BID) | ORAL | 0 refills | Status: DC

## 2019-10-18 MED ORDER — ONDANSETRON HCL 4 MG/2ML IJ SOLN
4.0000 mg | Freq: Once | INTRAMUSCULAR | Status: AC
  Administered 2019-10-18: 4 mg via INTRAVENOUS
  Filled 2019-10-18: qty 2

## 2019-10-18 MED ORDER — SODIUM CHLORIDE 0.9 % IV BOLUS
1000.0000 mL | Freq: Once | INTRAVENOUS | Status: AC
  Administered 2019-10-18: 1000 mL via INTRAVENOUS

## 2019-10-18 MED ORDER — MORPHINE SULFATE (PF) 4 MG/ML IV SOLN
4.0000 mg | Freq: Once | INTRAVENOUS | Status: AC
  Administered 2019-10-18: 15:00:00 4 mg via INTRAVENOUS
  Filled 2019-10-18: qty 1

## 2019-10-18 MED ORDER — KETOROLAC TROMETHAMINE 30 MG/ML IJ SOLN
30.0000 mg | Freq: Once | INTRAMUSCULAR | Status: AC
  Administered 2019-10-18: 30 mg via INTRAVENOUS
  Filled 2019-10-18: qty 1

## 2019-10-18 MED ORDER — NAPROXEN 500 MG PO TABS: 500.0000 mg | ORAL_TABLET | Freq: Two times a day (BID) | ORAL | 0 refills | Status: DC

## 2019-10-18 MED ORDER — ONDANSETRON 4 MG PO TBDP: ORAL_TABLET | ORAL | 0 refills | Status: DC

## 2019-10-18 NOTE — ED Triage Notes (Addendum)
Per EMS-complaining of abdominal pain, RLQ, for the past 2 weeks

## 2019-10-18 NOTE — Discharge Instructions (Addendum)
Take naproxen twice daily with food, Zofran as needed for nausea and vomiting, take Flagyl twice daily for the next week to treat for bacterial vaginosis, do not drink alcohol while on this antibiotic.  Follow-up with OB/GYN on the 17th as planned.  Return to the ED for any new or worsening symptoms.

## 2019-10-18 NOTE — ED Provider Notes (Signed)
[facility] DEPT Provider Note   CSN: [id number] Arrival date & time: 10/18/19  1240     History   Chief Complaint Chief Complaint  Patient presents with  . Abdominal Pain    HPI Diana Butler is a 38 y.o. female.     Diana Butler is a 39 y.o. female with a history of endometriosis, uterine fibroids, anemia, hypertension, dysmenorrhea and depression, who presents to the ED for evaluation of lower abdominal pain worse on the right side associated with vaginal bleeding.  Reports history of heavy vaginal bleeding associated with severe cramping and nausea vomiting which feels similar to her current symptoms.  She has had bleeding for almost 2 weeks, has been passing some blood clots.  Reports in the past she has had to receive a blood transfusion once due to heavy vaginal bleeding.  She reports cramping is a constant discomfort worse in the right lower quadrant.  She denies any associated fevers or chills.  Has had multiple episodes of nonbloody emesis.  Denies any diarrhea or constipation.  No dysuria or urinary frequency.  She has not noted any vaginal bleeding.  Reports that she has an upcoming follow-up appointment with OB/GYN, has known history of fibroids and endometriosis.  Symptoms not relieved with naproxen.  No other aggravating or alleviating factors.     Past Medical History:  Diagnosis Date  . Endometriosis    per patient report.   . Hypertension     Patient Active Problem List   Diagnosis Date Noted  . Anemia 02/21/2018  . Fibroid 07/22/2017  . Dysmenorrhea 07/22/2017  . Tobacco abuse 07/22/2017  . Depression 07/22/2017  . Menorrhagia with regular cycle 07/22/2017    Past Surgical History:  Procedure Laterality Date  . SALPINGECTOMY     right, ectopic. pt states had l/s and ex-lap  . SALPINGECTOMY     left, ectopic. pt states had l/s and ex-lap  . TONSILLECTOMY     age 13     OB History    Gravida  2   Para      Term       Preterm      AB  2   Living  0     SAB      TAB      Ectopic  2   Multiple      Live Births               Home Medications    Prior to Admission medications   Medication Sig Start Date End Date Taking? Authorizing Provider  ibuprofen (ADVIL,MOTRIN) 200 MG tablet 600mg  po q6h for two days once your period starts and then q6h as needed for pain 07/22/17  Yes Aletha Halim, MD  naproxen (NAPROSYN) 500 MG tablet Take 1 tablet (500 mg total) by mouth 2 (two) times daily with a meal. 09/12/19  Yes Lavonia Drafts, MD  TRAMADOL HCL PO Take 1 tablet by mouth once. Not patients prescription   Yes [provider]  ferrous sulfate 325 (65 FE) MG tablet Take 1 tablet (325 mg total) by mouth 2 (two) times daily with a meal. TAKE WITH ORANGE JUICE Patient not taking: Reported on 10/18/2019 01/08/18   Street, [place], PA-C  FLUoxetine (PROZAC) 10 MG capsule One tab po qday x1wk. Then two tabs po qday thereafter. Patient not taking: Reported on 10/18/2019 07/22/17   Aletha Halim, MD  ketorolac (TORADOL) 10 MG tablet Take 1 tablet (10 mg total) by  mouth every 6 (six) hours as needed for severe pain. Patient not taking: Reported on 10/18/2019 07/22/17   Aletha Halim, MD  medroxyPROGESTERone (PROVERA) 10 MG tablet One tab po q6h x 48-72 hours if your periods are very heavy and/or painful. Patient not taking: Reported on 10/18/2019 02/21/18   Aletha Halim, MD  metroNIDAZOLE (FLAGYL) 500 MG tablet Take 1 tablet (500 mg total) by mouth 2 (two) times daily. One po bid x 7 days 10/18/19   Jacqlyn Larsen, PA-C  naproxen (NAPROSYN) 500 MG tablet Take 1 tablet (500 mg total) by mouth 2 (two) times daily. 10/18/19   Jacqlyn Larsen, PA-C  norethindrone (AYGESTIN) 5 MG tablet Take 1 tablet (5 mg total) by mouth daily. Patient not taking: Reported on 10/18/2019 07/22/17   Aletha Halim, MD  norethindrone (AYGESTIN) 5 MG tablet Take 1 tablet (5 mg total) by mouth every 8  (eight) hours. Patient not taking: Reported on 10/18/2019 01/08/18   Street, [place], PA-C  ondansetron (ZOFRAN ODT) 4 MG disintegrating tablet 4mg  ODT q4 hours prn nausea/vomit 10/18/19   Jacqlyn Larsen, PA-C  oxyCODONE-acetaminophen (PERCOCET/ROXICET) 5-325 MG tablet Take 1-2 tablets by mouth every 6 (six) hours as needed. Patient not taking: Reported on 10/18/2019 06/18/17   Jorje Guild, NP    Family History No family history on file.  Social History Social History   Tobacco Use  . Smoking status: Former Smoker    Packs/day: 0.25    Years: 10.00    Pack years: 2.50    Types: Cigarettes    Quit date: 06/21/2017    Years since quitting: 2.3  . Smokeless tobacco: Never Used  Substance Use Topics  . Alcohol use: Yes    Comment: occ  . Drug use: No     Allergies   Patient has no known allergies.   Review of Systems Review of Systems  Constitutional: Negative for chills and fever.  HENT: Negative.   Respiratory: Negative for cough and shortness of breath.   Cardiovascular: Negative for chest pain.  Gastrointestinal: Positive for abdominal pain, nausea and vomiting. Negative for blood in stool and diarrhea.  Genitourinary: Positive for pelvic pain and vaginal bleeding. Negative for dysuria, flank pain and hematuria.  Musculoskeletal: Negative for arthralgias and myalgias.  Skin: Negative for color change and rash.  Neurological: Positive for light-headedness. Negative for dizziness and syncope.     Physical Exam Updated Vital Signs BP 121/73   Pulse (!) 57   Temp 98.4 F (36.9 C) (Oral)   Resp 16   SpO2 97%   Physical Exam Vitals signs and nursing note reviewed.  Constitutional:      General: She is not in acute distress.    Appearance: She is well-developed. She is not ill-appearing or diaphoretic.  HENT:     Head: Normocephalic and atraumatic.  Eyes:     General:        Right eye: No discharge.        Left eye: No discharge.     Pupils: Pupils are  equal, round, and reactive to light.  Neck:     Musculoskeletal: Neck supple.  Cardiovascular:     Rate and Rhythm: Normal rate and regular rhythm.     Heart sounds: Normal heart sounds.  Pulmonary:     Effort: Pulmonary effort is normal. No respiratory distress.     Breath sounds: Normal breath sounds. No wheezing or rales.     Comments: Respirations equal and unlabored, patient able to  speak in full sentences, lungs clear to auscultation bilaterally Abdominal:     General: Bowel sounds are normal. There is no distension.     Palpations: Abdomen is soft. There is no mass.     Tenderness: There is abdominal tenderness. There is no guarding.     Comments: Abdomen is soft, nondistended, bowel sounds present throughout, there is mild tenderness across the lower abdomen that is worse in the right lower quadrant, no guarding or rebound tenderness, no peritoneal signs  Musculoskeletal:        General: No deformity.  Skin:    General: Skin is warm and dry.     Capillary Refill: Capillary refill takes less than 2 seconds.  Neurological:     Mental Status: She is alert.     Coordination: Coordination normal.     Comments: Speech is clear, able to follow commands Moves extremities without ataxia, coordination intact  Psychiatric:        Mood and Affect: Mood normal.        Behavior: Behavior normal.      ED Treatments / Results  Labs (all labs ordered are listed, but only abnormal results are displayed) Labs Reviewed  WET PREP, GENITAL - Abnormal; Notable for the following components:      Result Value   Clue Cells Wet Prep HPF POC PRESENT (*)    WBC, Wet Prep HPF POC MODERATE (*)    All other components within normal limits  COMPREHENSIVE METABOLIC PANEL - Abnormal; Notable for the following components:   Potassium 3.4 (*)    Glucose, Bld 110 (*)    Total Protein 8.3 (*)    All other components within normal limits  CBC - Abnormal; Notable for the following components:   WBC  12.4 (*)    RBC 5.22 (*)    MCV 79.5 (*)    MCH 24.5 (*)    RDW 15.8 (*)    All other components within normal limits  URINALYSIS, ROUTINE W REFLEX MICROSCOPIC - Abnormal; Notable for the following components:   Color, Urine RED (*)    APPearance TURBID (*)    Glucose, UA   (*)    Value: TEST NOT REPORTED DUE TO COLOR INTERFERENCE OF URINE PIGMENT   Hgb urine dipstick   (*)    Value: TEST NOT REPORTED DUE TO COLOR INTERFERENCE OF URINE PIGMENT   Bilirubin Urine   (*)    Value: TEST NOT REPORTED DUE TO COLOR INTERFERENCE OF URINE PIGMENT   Ketones, ur   (*)    Value: TEST NOT REPORTED DUE TO COLOR INTERFERENCE OF URINE PIGMENT   Protein, ur   (*)    Value: TEST NOT REPORTED DUE TO COLOR INTERFERENCE OF URINE PIGMENT   Nitrite   (*)    Value: TEST NOT REPORTED DUE TO COLOR INTERFERENCE OF URINE PIGMENT   Leukocytes,Ua   (*)    Value: TEST NOT REPORTED DUE TO COLOR INTERFERENCE OF URINE PIGMENT   RBC / HPF >50 (*)    Bacteria, UA FEW (*)    All other components within normal limits  LIPASE, BLOOD  I-STAT BETA HCG BLOOD, ED ([facility], [facility], [facility] ONLY)  GC/CHLAMYDIA PROBE AMP ([place]) NOT AT [facility]    EKG None  Radiology US Transvaginal Non-ob  Result Date: 10/18/2019 CLINICAL DATA:  RIGHT pelvic pain since this morning, history of endometriosis; LMP 10/07/2019 EXAM: TRANSABDOMINAL AND TRANSVAGINAL ULTRASOUND OF PELVIS DOPPLER ULTRASOUND OF OVARIES TECHNIQUE: Both transabdominal and transvaginal  ultrasound examinations of the pelvis were performed. Transabdominal technique was performed for global imaging of the pelvis including uterus, ovaries, adnexal regions, and pelvic cul-de-sac. It was necessary to proceed with endovaginal exam following the transabdominal exam to visualize the endometrium and RIGHT ovary. Color and duplex Doppler ultrasound was utilized to evaluate blood flow to the ovaries. COMPARISON:  01/08/2018 FINDINGS: Uterus Measurements: 8.4 x 4.4 x 4.7 cm = volume: 91 mL.  Anteverted. Small submucosal leiomyoma at upper uterus 2.9 x 1.5 x 1.8 cm. Additional tiny fundal leiomyoma 11 x 11 x 7 mm. Endometrium Thickness: Poorly defined and heterogeneous, question up to 23 mm thick versus adjacent submucosal leiomyoma. No endometrial fluid Right ovary Measurements: 3.3 x 2.0 x 2.0 cm = volume: 6.7 mL. Normal morphology without mass. Internal blood flow present on color Doppler imaging. Left ovary Measurements: 3.6 x 2.4 x 1.8 cm = volume: 8.1 mL. Normal morphology without mass. Pulsed Doppler evaluation of both ovaries demonstrates normal low-resistance arterial and venous waveforms. Other findings Trace free pelvic fluid.  No adnexal masses. IMPRESSION: Small uterine leiomyomata. No evidence of ovarian torsion. Abnormal appearance of the endometrial complex, poorly defined and heterogeneous, question thickened up to 23 mm thick versus adjacent submucosal leiomyoma. Endometrial thickness is considered abnormal. Consider follow-up by [place] in 6-8 weeks, during the week immediately following menses (exam timing is critical). Electronically Signed   By: Lavonia Dana M.D.   On: 10/18/2019 17:55   US Pelvis Complete  Result Date: 10/18/2019 CLINICAL DATA:  RIGHT pelvic pain since this morning, history of endometriosis; LMP 10/07/2019 EXAM: TRANSABDOMINAL AND TRANSVAGINAL ULTRASOUND OF PELVIS DOPPLER ULTRASOUND OF OVARIES TECHNIQUE: Both transabdominal and transvaginal ultrasound examinations of the pelvis were performed. Transabdominal technique was performed for global imaging of the pelvis including uterus, ovaries, adnexal regions, and pelvic cul-de-sac. It was necessary to proceed with endovaginal exam following the transabdominal exam to visualize the endometrium and RIGHT ovary. Color and duplex Doppler ultrasound was utilized to evaluate blood flow to the ovaries. COMPARISON:  01/08/2018 FINDINGS: Uterus Measurements: 8.4 x 4.4 x 4.7 cm = volume: 91 mL. Anteverted. Small submucosal  leiomyoma at upper uterus 2.9 x 1.5 x 1.8 cm. Additional tiny fundal leiomyoma 11 x 11 x 7 mm. Endometrium Thickness: Poorly defined and heterogeneous, question up to 23 mm thick versus adjacent submucosal leiomyoma. No endometrial fluid Right ovary Measurements: 3.3 x 2.0 x 2.0 cm = volume: 6.7 mL. Normal morphology without mass. Internal blood flow present on color Doppler imaging. Left ovary Measurements: 3.6 x 2.4 x 1.8 cm = volume: 8.1 mL. Normal morphology without mass. Pulsed Doppler evaluation of both ovaries demonstrates normal low-resistance arterial and venous waveforms. Other findings Trace free pelvic fluid.  No adnexal masses. IMPRESSION: Small uterine leiomyomata. No evidence of ovarian torsion. Abnormal appearance of the endometrial complex, poorly defined and heterogeneous, question thickened up to 23 mm thick versus adjacent submucosal leiomyoma. Endometrial thickness is considered abnormal. Consider follow-up by [place] in 6-8 weeks, during the week immediately following menses (exam timing is critical). Electronically Signed   By: Lavonia Dana M.D.   On: 10/18/2019 17:55   [place] Art/ven Flow Abd Pelv Doppler  Result Date: 10/18/2019 CLINICAL DATA:  RIGHT pelvic pain since this morning, history of endometriosis; LMP 10/07/2019 EXAM: TRANSABDOMINAL AND TRANSVAGINAL ULTRASOUND OF PELVIS DOPPLER ULTRASOUND OF OVARIES TECHNIQUE: Both transabdominal and transvaginal ultrasound examinations of the pelvis were performed. Transabdominal technique was performed for global imaging of the pelvis including uterus, ovaries, adnexal regions, and  pelvic cul-de-sac. It was necessary to proceed with endovaginal exam following the transabdominal exam to visualize the endometrium and RIGHT ovary. Color and duplex Doppler ultrasound was utilized to evaluate blood flow to the ovaries. COMPARISON:  01/08/2018 FINDINGS: Uterus Measurements: 8.4 x 4.4 x 4.7 cm = volume: 91 mL. Anteverted. Small submucosal leiomyoma at upper  uterus 2.9 x 1.5 x 1.8 cm. Additional tiny fundal leiomyoma 11 x 11 x 7 mm. Endometrium Thickness: Poorly defined and heterogeneous, question up to 23 mm thick versus adjacent submucosal leiomyoma. No endometrial fluid Right ovary Measurements: 3.3 x 2.0 x 2.0 cm = volume: 6.7 mL. Normal morphology without mass. Internal blood flow present on color Doppler imaging. Left ovary Measurements: 3.6 x 2.4 x 1.8 cm = volume: 8.1 mL. Normal morphology without mass. Pulsed Doppler evaluation of both ovaries demonstrates normal low-resistance arterial and venous waveforms. Other findings Trace free pelvic fluid.  No adnexal masses. IMPRESSION: Small uterine leiomyomata. No evidence of ovarian torsion. Abnormal appearance of the endometrial complex, poorly defined and heterogeneous, question thickened up to 23 mm thick versus adjacent submucosal leiomyoma. Endometrial thickness is considered abnormal. Consider follow-up by [place] in 6-8 weeks, during the week immediately following menses (exam timing is critical). Electronically Signed   By: Lavonia Dana M.D.   On: 10/18/2019 17:55    Procedures Procedures (including critical care time)  Medications Ordered in ED Medications  sodium chloride 0.9 % bolus 1,000 mL (0 mLs Intravenous Stopped 10/18/19 1655)  ondansetron (ZOFRAN) injection 4 mg (4 mg Intravenous Given 10/18/19 1455)  morphine 4 MG/ML injection 4 mg (4 mg Intravenous Given 10/18/19 1455)  ketorolac (TORADOL) 30 MG/ML injection 30 mg (30 mg Intravenous Given 10/18/19 1655)     Initial Impression / Assessment and Plan / ED Course  I have reviewed the triage vital signs and the nursing notes.  Pertinent labs & imaging results that were available during my care of the patient were reviewed by me and considered in my medical decision making (see chart for details).  Patient presents with lower abdominal and pelvic pain associated with vaginal bleeding.  She has been bleeding for almost 2 weeks, reports  that when she does have menstrual cycles they are usually severe associated with heavy bleeding, severe abdominal cramping, nausea and vomiting and this is similar but worse than usual.  She reports she has passed some blood clots and had a brief episode of lightheadedness today.  Reports about a year ago she did require a blood transfusion due to her heavy vaginal bleeding.  She has been told she has uterine fibroids as well as endometriosis.  She has an upcoming follow-up appointment with Dr. Ilda Basset with OB/GYN.  She is not currently on any hormonal birth control.  Has been taking naproxen without improvement.  On exam patient has mild tenderness across the lower abdomen that is worse in the right lower quadrant.  On pelvic exam she has right adnexal tenderness with small amount of bleeding, no blood clots noted in the vaginal vault, no cervical motion tenderness.  Will get abdominal labs and pelvic ultrasound to rule out ovarian torsion.  IV fluids and pain medication given.  Lab work shows mild leukocytosis, stable hemoglobin at 12.8, no significant electrolyte derangements, normal renal and liver function.  Normal lipase.  Negative pregnancy.  Urinalysis with blood present, but without clear signs of infection and patient without urinary symptoms.  Wet prep consistent with BV, which patient would like treatment for.   Pelvic ultrasound  shows small uterine fibroid, no evidence of ovarian torsion, there is an abnormal appearance of the endometrial complex which could be thickening versus submucosal fibroids.  Recommend follow-up with OB/GYN.  Final Clinical Impressions(s) / ED Diagnoses   Final diagnoses:  Right lower quadrant abdominal pain  Non-intractable vomiting with nausea, unspecified vomiting type  BV (bacterial vaginosis)  Vaginal bleeding    ED Discharge Orders         Ordered    ondansetron (ZOFRAN ODT) 4 MG disintegrating tablet     10/18/19 1836    naproxen (NAPROSYN) 500 MG  tablet  2 times daily     10/18/19 1836    metroNIDAZOLE (FLAGYL) 500 MG tablet  2 times daily     10/18/19 1836           Jacqlyn Larsen, PA-C 10/21/19 1631    Lacretia Leigh, MD 10/23/19 [phone]

## 2019-10-20 LAB — GC/CHLAMYDIA PROBE AMP (~~LOC~~) NOT AT ARMC
Chlamydia: NEGATIVE
Neisseria Gonorrhea: NEGATIVE

## 2019-10-23 ENCOUNTER — Telehealth: Payer: Self-pay | Admitting: Obstetrics and Gynecology

## 2019-10-23 NOTE — Telephone Encounter (Signed)
Spoke to patient about her appointment on 11/17 @ 10:35. Patient instructed to wear a face mask for the entire appointment and no visitors are allowed with her during the visit. Patient screened for covid symptoms and denied having any

## 2019-10-24 ENCOUNTER — Ambulatory Visit (INDEPENDENT_AMBULATORY_CARE_PROVIDER_SITE_OTHER): Payer: Self-pay | Admitting: Obstetrics and Gynecology

## 2019-10-24 ENCOUNTER — Other Ambulatory Visit: Payer: Self-pay

## 2019-10-24 ENCOUNTER — Encounter: Payer: Self-pay | Admitting: Obstetrics and Gynecology

## 2019-10-24 VITALS — BP 126/82 | HR 79 | Wt 155.9 lb

## 2019-10-24 DIAGNOSIS — N946 Dysmenorrhea, unspecified: Secondary | ICD-10-CM

## 2019-10-24 DIAGNOSIS — D250 Submucous leiomyoma of uterus: Secondary | ICD-10-CM

## 2019-10-24 DIAGNOSIS — Z1231 Encounter for screening mammogram for malignant neoplasm of breast: Secondary | ICD-10-CM

## 2019-10-24 DIAGNOSIS — N939 Abnormal uterine and vaginal bleeding, unspecified: Secondary | ICD-10-CM

## 2019-10-24 MED ORDER — MEDROXYPROGESTERONE ACETATE 10 MG PO TABS: ORAL_TABLET | ORAL | 1 refills | Status: DC

## 2019-10-24 NOTE — Progress Notes (Addendum)
Obstetrics and Gynecology Visit Return Patient Evaluation  Appointment Date: 10/24/2019  Referring Provider: [facility]  OBGYN Clinic: [facility]  Chief Complaint: follow up dysmenorrhea and menorrhagia  History of Present Illness:  Diana Butler is a 38 y.o. P0 with above CC. Patient seen in ED on 11/11 for above CC. Labs, swabs were all negative. U/s then was consistent with prior ones (see below).  She was given a shot of toradol and morphine and zofran and flagyl. Pt has long history of dysmenorrhea and menorrhagia.   Review of Systems:  as noted in the History of Present Illness.  Patient Active Problem List   Diagnosis Date Noted  . Submucous uterine fibroid 10/24/2019  . Abnormal uterine bleeding (AUB) 10/24/2019  . Anemia 02/21/2018  . Fibroid 07/22/2017  . Dysmenorrhea 07/22/2017  . Tobacco abuse 07/22/2017  . Depression 07/22/2017  . Menorrhagia with regular cycle 07/22/2017   Medications:  Diana Butler had no medications administered during this visit. Current Outpatient Medications  Medication Sig Dispense Refill  . naproxen (NAPROSYN) 500 MG tablet Take 1 tablet (500 mg total) by mouth 2 (two) times daily. 30 tablet 0  . naproxen (NAPROSYN) 500 MG tablet Take 1 tablet (500 mg total) by mouth 2 (two) times daily with a meal. 60 tablet 2   No current facility-administered medications for this visit.     Allergies: has No Known Allergies.  Physical Exam:  BP 126/82   Pulse 79   Wt 155 lb 14.4 oz (70.7 kg)   LMP 10/07/2019   BMI 4214.14 kg/m  Body mass index is 4,214.14 kg/m. General appearance: Well nourished, well developed female in no acute distress.  Pulmonary: CTAB CV: normal s1 and s2, no MRGs. HR 110s Abdomen: diffusely non tender to palpation, non distended, and no masses, hernias Neuro/Psych:  Normal mood and affect.    Pelvic exam:  EGBUS, vaginal vault and cervix: within normal limits  Uterus: small, mobile,  nttp  Radiology: CLINICAL DATA:  RIGHT pelvic pain since this morning, history of endometriosis; LMP 10/07/2019  EXAM: TRANSABDOMINAL AND TRANSVAGINAL ULTRASOUND OF PELVIS  DOPPLER ULTRASOUND OF OVARIES  TECHNIQUE: Both transabdominal and transvaginal ultrasound examinations of the pelvis were performed. Transabdominal technique was performed for global imaging of the pelvis including uterus, ovaries, adnexal regions, and pelvic cul-de-sac.  It was necessary to proceed with endovaginal exam following the transabdominal exam to visualize the endometrium and RIGHT ovary. Color and duplex Doppler ultrasound was utilized to evaluate blood flow to the ovaries.  COMPARISON:  01/08/2018  FINDINGS: Uterus  Measurements: 8.4 x 4.4 x 4.7 cm = volume: 91 mL. Anteverted. Small submucosal leiomyoma at upper uterus 2.9 x 1.5 x 1.8 cm. Additional tiny fundal leiomyoma 11 x 11 x 7 mm.  Endometrium  Thickness: Poorly defined and heterogeneous, question up to 23 mm thick versus adjacent submucosal leiomyoma. No endometrial fluid  Right ovary  Measurements: 3.3 x 2.0 x 2.0 cm = volume: 6.7 mL. Normal morphology without mass. Internal blood flow present on color Doppler imaging.  Left ovary  Measurements: 3.6 x 2.4 x 1.8 cm = volume: 8.1 mL. Normal morphology without mass.  Pulsed Doppler evaluation of both ovaries demonstrates normal low-resistance arterial and venous waveforms.  Other findings  Trace free pelvic fluid.  No adnexal masses.  IMPRESSION: Small uterine leiomyomata.  No evidence of ovarian torsion.  Abnormal appearance of the endometrial complex, poorly defined and heterogeneous, question thickened up to 23 mm thick versus adjacent  submucosal leiomyoma.  Endometrial thickness is considered abnormal. Consider follow-up by [place] in 6-8 weeks, during the week immediately following menses (exam timing is critical).   Electronically Signed    By: Diana Butler M.D.   On: 10/18/2019 17:55  Assessment: pt stable  Plan:  1. Abnormal uterine bleeding (AUB) I last saw the patient in 02/2018 with similar s/s but she did not have insurance coverage at that time and she was put on Aygestin. She states that this did not work, and unfortunately, she still does not have insurance coverage. She does desire to leave IVF open as a future option. I told her that I recommend getting a tsh, pap, embx and doing a hysteroscopy, submucosal fibroid removal, since I don't think depo provera would work given the fibroid. As stated above, she has tried aygestin in the past and states it didn't work, but she doesn't believe she's ever tried po provera, so will try that in the interim, while she applies for coverage. She was also given information on [facility] re: mammograms and pap smears  RTC: 52m follow up  Diana Romans MD Attending Center for [facility])

## 2019-12-25 ENCOUNTER — Encounter: Payer: Self-pay | Admitting: Obstetrics and Gynecology

## 2019-12-25 ENCOUNTER — Ambulatory Visit: Payer: Medicaid Other | Admitting: Obstetrics and Gynecology

## 2019-12-26 ENCOUNTER — Encounter (HOSPITAL_COMMUNITY): Payer: Self-pay | Admitting: Emergency Medicine

## 2019-12-26 ENCOUNTER — Emergency Department (HOSPITAL_COMMUNITY): Payer: Medicaid Other

## 2019-12-26 ENCOUNTER — Emergency Department (HOSPITAL_COMMUNITY)
Admission: EM | Admit: 2019-12-26 | Discharge: 2019-12-26 | Disposition: A | Discharge status: Home / Self Care | Payer: Medicaid Other | Attending: Emergency Medicine | Admitting: Emergency Medicine

## 2019-12-26 ENCOUNTER — Other Ambulatory Visit: Payer: Self-pay

## 2019-12-26 DIAGNOSIS — I10 Essential (primary) hypertension: Secondary | ICD-10-CM | POA: Insufficient documentation

## 2019-12-26 DIAGNOSIS — R1030 Lower abdominal pain, unspecified: Secondary | ICD-10-CM

## 2019-12-26 DIAGNOSIS — Z79899 Other long term (current) drug therapy: Secondary | ICD-10-CM | POA: Insufficient documentation

## 2019-12-26 DIAGNOSIS — R102 Pelvic and perineal pain: Secondary | ICD-10-CM | POA: Insufficient documentation

## 2019-12-26 DIAGNOSIS — D259 Leiomyoma of uterus, unspecified: Secondary | ICD-10-CM

## 2019-12-26 DIAGNOSIS — N946 Dysmenorrhea, unspecified: Secondary | ICD-10-CM

## 2019-12-26 DIAGNOSIS — F17210 Nicotine dependence, cigarettes, uncomplicated: Secondary | ICD-10-CM | POA: Insufficient documentation

## 2019-12-26 LAB — URINALYSIS, ROUTINE W REFLEX MICROSCOPIC
Bacteria, UA: NONE SEEN
Bilirubin Urine: NEGATIVE
Glucose, UA: NEGATIVE mg/dL
Ketones, ur: NEGATIVE mg/dL
Nitrite: NEGATIVE
Protein, ur: 30 mg/dL — AB
RBC / HPF: >50 RBC/hpf — ABNORMAL HIGH (ref 0–5)
Specific Gravity, Urine: 1.016 (ref 1.005–1.030)
pH: 6 (ref 5.0–8.0)

## 2019-12-26 LAB — COMPREHENSIVE METABOLIC PANEL
ALT: 13 U/L (ref 0–44)
AST: 15 U/L (ref 15–41)
Albumin: 3.9 g/dL (ref 3.5–5.0)
Alkaline Phosphatase: 49 U/L (ref 38–126)
Anion gap: 9 (ref 5–15)
BUN: 9 mg/dL (ref 6–20)
CO2: 21 mmol/L — ABNORMAL LOW (ref 22–32)
Calcium: 9.3 mg/dL (ref 8.9–10.3)
Chloride: 108 mmol/L (ref 98–111)
Creatinine, Ser: 0.95 mg/dL (ref 0.44–1.00)
GFR calc Af Amer: >60 mL/min (ref >60)
GFR calc non Af Amer: >60 mL/min (ref >60)
Glucose, Bld: 128 mg/dL — ABNORMAL HIGH (ref 70–99)
Potassium: 3.3 mmol/L — ABNORMAL LOW (ref 3.5–5.1)
Sodium: 138 mmol/L (ref 135–145)
Total Bilirubin: 0.4 mg/dL (ref 0.3–1.2)
Total Protein: 7.2 g/dL (ref 6.5–8.1)

## 2019-12-26 LAB — CBC
HCT: 38.5 % (ref 36.0–46.0)
Hemoglobin: 12 g/dL (ref 12.0–15.0)
MCH: 24 pg — ABNORMAL LOW (ref 26.0–34.0)
MCHC: 31.2 g/dL (ref 30.0–36.0)
MCV: 77.2 fL — ABNORMAL LOW (ref 80.0–100.0)
Platelets: 285 10*3/uL (ref 150–400)
RBC: 4.99 MIL/uL (ref 3.87–5.11)
RDW: 16.3 % — ABNORMAL HIGH (ref 11.5–15.5)
WBC: 9.3 10*3/uL (ref 4.0–10.5)
nRBC: 0 % (ref 0.0–0.2)

## 2019-12-26 LAB — HIV ANTIBODY (ROUTINE TESTING W REFLEX): HIV Screen 4th Generation wRfx: NONREACTIVE

## 2019-12-26 LAB — LIPASE, BLOOD: Lipase: 32 U/L (ref 11–51)

## 2019-12-26 LAB — WET PREP, GENITAL
Clue Cells Wet Prep HPF POC: NONE SEEN
Sperm: NONE SEEN
Trich, Wet Prep: NONE SEEN
Yeast Wet Prep HPF POC: NONE SEEN

## 2019-12-26 LAB — PREGNANCY, URINE: Preg Test, Ur: NEGATIVE

## 2019-12-26 MED ORDER — IOHEXOL 300 MG/ML  SOLN
100.0000 mL | Freq: Once | INTRAMUSCULAR | Status: AC | PRN
  Administered 2019-12-26: 100 mL via INTRAVENOUS

## 2019-12-26 MED ORDER — SODIUM CHLORIDE 0.9 % IV BOLUS
1000.0000 mL | Freq: Once | INTRAVENOUS | Status: AC
  Administered 2019-12-26: 1000 mL via INTRAVENOUS

## 2019-12-26 MED ORDER — ONDANSETRON HCL 4 MG/2ML IJ SOLN
2.0000 mg | Freq: Once | INTRAMUSCULAR | Status: AC
  Administered 2019-12-26: 2 mg via INTRAVENOUS
  Filled 2019-12-26: qty 2

## 2019-12-26 MED ORDER — MORPHINE SULFATE (PF) 4 MG/ML IV SOLN
4.0000 mg | Freq: Once | INTRAVENOUS | Status: AC
  Administered 2019-12-26: 4 mg via INTRAVENOUS
  Filled 2019-12-26: qty 1

## 2019-12-26 MED ORDER — SODIUM CHLORIDE 0.9% FLUSH
3.0000 mL | Freq: Once | INTRAVENOUS | Status: AC
  Administered 2019-12-26: 3 mL via INTRAVENOUS

## 2019-12-26 MED ORDER — HYDROMORPHONE HCL 1 MG/ML IJ SOLN
0.5000 mg | Freq: Once | INTRAMUSCULAR | Status: AC
  Administered 2019-12-26: 0.5 mg via INTRAVENOUS
  Filled 2019-12-26: qty 1

## 2019-12-26 MED ORDER — KETOROLAC TROMETHAMINE 15 MG/ML IJ SOLN
15.0000 mg | Freq: Once | INTRAMUSCULAR | Status: AC
  Administered 2019-12-26: 15 mg via INTRAVENOUS
  Filled 2019-12-26: qty 1

## 2019-12-26 NOTE — ED Provider Notes (Addendum)
[facility] DEPARTMENT Provider Note   CSN: [id number] Arrival date & time: 12/26/19  1341     History Chief Complaint  Patient presents with  . Abdominal Pain    Diana Butler is a 39 y.o. female history of endometriosis, hypertension, uterine fibroid, dysmenorrhea.  Patient presents today for lower abdominal pain that began approximately 1 hour prior to arrival.  She reports that she has been on her menstrual cycle for the past 2 weeks which is a normal duration for her.  She reports pain is a severe cramping sensation constant nonradiating no clear aggravating or alleviating factors.  She reports that cramping is normally mild however acutely worsened 1 hour prior to arrival.  She reports nausea and mild diarrhea that is nonbloody has only associated symptoms.  Denies fever/chills, headache, chest pain/shortness of breath, cough, vomiting, dysuria/hematuria, vaginal discharge, concern for STI or additional concerns.  HPI     Past Medical History:  Diagnosis Date  . Endometriosis    per patient report.   . Hypertension     Patient Active Problem List   Diagnosis Date Noted  . Submucous uterine fibroid 10/24/2019  . Abnormal uterine bleeding (AUB) 10/24/2019  . Anemia 02/21/2018  . Fibroid 07/22/2017  . Dysmenorrhea 07/22/2017  . Tobacco abuse 07/22/2017  . Depression 07/22/2017  . Menorrhagia with regular cycle 07/22/2017    Past Surgical History:  Procedure Laterality Date  . SALPINGECTOMY     right, ectopic. pt states had l/s and ex-lap  . SALPINGECTOMY     left, ectopic. pt states had l/s and ex-lap  . TONSILLECTOMY     age 44     OB History    Gravida  2   Para      Term      Preterm      AB  2   Living  0     SAB      TAB      Ectopic  2   Multiple      Live Births              No family history on file.  Social History   Tobacco Use  . Smoking status: Current Every Day Smoker    Packs/day:  0.50    Years: 12.00    Pack years: 6.00    Types: Cigarettes  . Smokeless tobacco: Never Used  Substance Use Topics  . Alcohol use: Yes    Comment: occ  . Drug use: No    Home Medications Prior to Admission medications   Medication Sig Start Date End Date Taking? Authorizing Provider  medroxyPROGESTERone (PROVERA) 10 MG tablet One tab po q6h x 48-72 hours if your periods are very heavy and/or painful. Patient taking differently: Take 10 mg by mouth every 6 (six) hours. Take for 48-72 hours if your periods are very heavy and/or painful. 10/24/19  Yes Aletha Halim, MD  naproxen (NAPROSYN) 500 MG tablet Take 1 tablet (500 mg total) by mouth 2 (two) times daily. 10/18/19  Yes Jacqlyn Larsen, PA-C  ferrous sulfate 325 (65 FE) MG tablet Take 1 tablet (325 mg total) by mouth 2 (two) times daily with a meal. TAKE WITH ORANGE JUICE Patient not taking: Reported on 10/18/2019 01/08/18   Street, [place], PA-C  FLUoxetine (PROZAC) 10 MG capsule One tab po qday x1wk. Then two tabs po qday thereafter. Patient not taking: Reported on 10/18/2019 07/22/17   Aletha Halim, MD  naproxen (NAPROSYN)  500 MG tablet Take 1 tablet (500 mg total) by mouth 2 (two) times daily with a meal. Patient not taking: Reported on 12/26/2019 09/12/19   Lavonia Drafts, MD  norethindrone (AYGESTIN) 5 MG tablet Take 1 tablet (5 mg total) by mouth daily. Patient not taking: Reported on 10/18/2019 07/22/17   Aletha Halim, MD  norethindrone (AYGESTIN) 5 MG tablet Take 1 tablet (5 mg total) by mouth every 8 (eight) hours. Patient not taking: Reported on 10/18/2019 01/08/18   Street, [place], PA-C  ondansetron (ZOFRAN ODT) 4 MG disintegrating tablet 4mg  ODT q4 hours prn nausea/vomit Patient not taking: Reported on 12/26/2019 10/18/19   Jacqlyn Larsen, PA-C  oxyCODONE-acetaminophen (PERCOCET/ROXICET) 5-325 MG tablet Take 1-2 tablets by mouth every 6 (six) hours as needed. Patient not taking: Reported on 10/18/2019  06/18/17   Jorje Guild, NP    Allergies    Patient has no known allergies.  Review of Systems   Review of Systems Ten systems are reviewed and are negative for acute change except as noted in the HPI  Physical Exam Updated Vital Signs BP (!) 154/96   Pulse (!) 54   Temp 99.1 F (37.3 C) (Oral)   Resp 17   LMP 12/10/2019   SpO2 100%   Physical Exam Constitutional:      General: She is not in acute distress.    Appearance: Normal appearance. She is well-developed. She is not ill-appearing or diaphoretic.  HENT:     Head: Normocephalic and atraumatic.     Right Ear: External ear normal.     Left Ear: External ear normal.     Nose: Nose normal.  Eyes:     General: Vision grossly intact. Gaze aligned appropriately.     Pupils: Pupils are equal, round, and reactive to light.  Neck:     Trachea: Trachea and phonation normal. No tracheal deviation.  Pulmonary:     Effort: Pulmonary effort is normal. No respiratory distress.  Abdominal:     General: There is no distension.     Palpations: Abdomen is soft.     Tenderness: There is abdominal tenderness in the right lower quadrant and suprapubic area. There is no guarding or rebound.  Genitourinary:    Comments: Exam chaperoned by Jena Gauss.  Pelvic exam: normal external genitalia without evidence of trauma. VULVA: normal appearing vulva with no masses, tenderness or lesion. VAGINA: normal appearing vagina with normal color and discharge, no lesions. CERVIX: normal appearing cervix without lesions, cervical motion tenderness absent, cervical os closed without purulent discharge; bleeding present consistent with menstrual cycle Wet prep and DNA probe for chlamydia and GC obtained.  ADNEXA: normal adnexa in size, nontender and no masses UTERUS: uterus is normal size, shape, consistency and nontender.  Musculoskeletal:        General: Normal range of motion.     Cervical back: Normal range of motion.  Skin:    General: Skin  is warm and dry.  Neurological:     Mental Status: She is alert.     GCS: GCS eye subscore is 4. GCS verbal subscore is 5. GCS motor subscore is 6.     Comments: Speech is clear and goal oriented, follows commands Major Cranial nerves without deficit, no facial droop Moves extremities without ataxia, coordination intact  Psychiatric:        Behavior: Behavior normal.    ED Results / Procedures / Treatments   Labs (all labs ordered are listed, but only abnormal results are displayed)  Labs Reviewed  WET PREP, GENITAL - Abnormal; Notable for the following components:      Result Value   WBC, Wet Prep HPF POC MANY (*)    All other components within normal limits  COMPREHENSIVE METABOLIC PANEL - Abnormal; Notable for the following components:   Potassium 3.3 (*)    CO2 21 (*)    Glucose, Bld 128 (*)    All other components within normal limits  CBC - Abnormal; Notable for the following components:   MCV 77.2 (*)    MCH 24.0 (*)    RDW 16.3 (*)    All other components within normal limits  URINALYSIS, ROUTINE W REFLEX MICROSCOPIC - Abnormal; Notable for the following components:   Color, Urine AMBER (*)    APPearance CLOUDY (*)    Hgb urine dipstick LARGE (*)    Protein, ur 30 (*)    Leukocytes,Ua TRACE (*)    RBC / HPF >50 (*)    All other components within normal limits  LIPASE, BLOOD  PREGNANCY, URINE  HIV ANTIBODY (ROUTINE TESTING W REFLEX)  RPR  I-STAT BETA HCG BLOOD, ED ([facility], [facility], [facility] ONLY)  GC/CHLAMYDIA PROBE AMP ([place]) NOT AT [facility]    EKG None  Radiology US PELVIC COMPLETE W TRANSVAGINAL AND TORSION R/O  Result Date: 12/26/2019 CLINICAL DATA:  Pelvic pain EXAM: TRANSABDOMINAL AND TRANSVAGINAL ULTRASOUND OF PELVIS DOPPLER ULTRASOUND OF OVARIES TECHNIQUE: Both transabdominal and transvaginal ultrasound examinations of the pelvis were performed. Transabdominal technique was performed for global imaging of the pelvis including uterus, ovaries, adnexal regions, and  pelvic cul-de-sac. It was necessary to proceed with endovaginal exam following the transabdominal exam to visualize the uterus endometrium ovaries. Color and duplex Doppler ultrasound was utilized to evaluate blood flow to the ovaries. COMPARISON:  Pelvic ultrasound 10/18/2019, 01/09/2018, 06/18/2017 FINDINGS: Uterus Measurements: 8.5 x 4.7 x 6.1 cm = volume: 128.9 mL. Heterogeneous echotexture throughout. Multiple fibroids. Exophytic anterior fundal fibroid measures 9 x 9 x 11 mm. Sub mucosal right anterior fundal fibroid measures 1 x 0.9 x 0.8 cm. Heterogenous submucosal fibroid measures 2.4 x 1.6 x 2.6 cm. Endometrium Thickness: 11 mm.  No focal abnormality visualized. Right ovary Measurements: 3 x 1.8 x 2 cm = volume: 5.7 mL. Normal appearance/no adnexal mass. Left ovary Measurements: 3.3 x 2.1 x 2.7 cm = volume: 9.7 mL. Normal appearance/no adnexal mass. Pulsed Doppler evaluation of both ovaries demonstrates normal low-resistance arterial and venous waveforms. Other findings Small free fluid in the right adnexa. IMPRESSION: 1. Negative for ovarian torsion. 2. Heterogenous uterus with multiple fibroids. 3. Small amount of right adnexal fluid. Electronically Signed   By: Donavan Foil M.D.   On: 12/26/2019 17:34    Procedures Procedures (including critical care time)  Medications Ordered in ED Medications  sodium chloride flush (NS) 0.9 % injection 3 mL (3 mLs Intravenous Given 12/26/19 1816)  morphine 4 MG/ML injection 4 mg (4 mg Intravenous Given 12/26/19 1551)  sodium chloride 0.9 % bolus 1,000 mL (0 mLs Intravenous Stopped 12/26/19 1816)  ondansetron (ZOFRAN) injection 2 mg (2 mg Intravenous Given 12/26/19 1814)  HYDROmorphone (DILAUDID) injection 0.5 mg (0.5 mg Intravenous Given 12/26/19 1814)  iohexol (OMNIPAQUE) 300 MG/ML solution 100 mL (100 mLs Intravenous Contrast Given 12/26/19 1931)  ketorolac (TORADOL) 15 MG/ML injection 15 mg (15 mg Intravenous Given 12/26/19 2131)    ED Course  I have  reviewed the triage vital signs and the nursing notes.  Pertinent labs & imaging results that were  available during my care of the patient were reviewed by me and considered in my medical decision making (see chart for details).  Clinical Course as of Dec 26 2151  Tue Dec 26, 2019  2106 CLINICAL DATA: Right lower quadrant pain  EXAM: CT ABDOMEN AND PELVIS WITH CONTRAST  TECHNIQUE: Multidetector CT imaging of the abdomen and pelvis was performed using the standard protocol following bolus administration of intravenous contrast.  CONTRAST: 133mL OMNIPAQUE IOHEXOL 300 MG/ML SOLN  COMPARISON: 06/18/2017 CT  FINDINGS: Lower chest: No acute abnormality.  Hepatobiliary: No focal liver abnormality is seen. No gallstones, gallbladder wall thickening, or biliary dilatation.  Pancreas: Unremarkable. No pancreatic ductal dilatation or surrounding inflammatory changes.  Spleen: Normal in size without focal abnormality.  Adrenals/Urinary Tract: Adrenal glands are unremarkable. Kidneys are normal, without renal calculi, focal lesion, or hydronephrosis. Bladder is unremarkable.  Stomach/Bowel: Stomach is within normal limits. Appendix appears normal. No evidence of bowel wall thickening, distention, or inflammatory changes.  Vascular/Lymphatic: No significant vascular findings are present. No enlarged abdominal or pelvic lymph nodes.  Reproductive: Heterogenous lobulated uterus with multiple hypoenhancing masses corresponding to fibroids seen on ultrasound. No adnexal mass.  Other: No abdominal wall hernia or abnormality. No abdominopelvic ascites.  Musculoskeletal: No acute or significant osseous findings.  IMPRESSION: 1. No CT evidence for acute intra-abdominal or pelvic abnormality. 2. Heterogenous lobulated fibroid uterus.   Electronically Signed By: Donavan Foil M.D. On: 12/26/2019 19:37   [BM]    Clinical Course User Index [BM] Gari Crown   MDM  Rules/Calculators/A&P                     39 year old female history of endometriosis and fibroids arrives holding lower abdomen, acutely worsening pain beginning 1 hour prior to arrival.  On exam pain appears worse right compared to left.  Will obtain abdominal lab work in addition to ultrasound of the pelvis for evaluation of possible torsion. - IV fluids given in addition to morphine for pain control.  Minimal improvement.  Pelvic examination shows bleeding consistent with menstrual cycle, no cervical motion tenderness, enlarged uterus otherwise within normal limits.  Pelvic ultrasound:  IMPRESSION:  1. Negative for ovarian torsion.  2. Heterogenous uterus with multiple fibroids.  3. Small amount of right adnexal fluid.   CBC nonacute Lipase within normal limit CMP nonacute Urinalysis with blood consistent with menstrual cycle, appears contaminated, patient without urinary symptoms doubt UTI Pregnancy test negative Wet prep shows many WBCs suspect consistent with menstrual cycle HIV nonreactive  As patient's pain uncontrolled CT abdomen pelvis was obtained for evaluation of possible appendicitis. - CT AP: IMPRESSION: 1. No CT evidence for acute intra-abdominal or pelvic abnormality. 2. Heterogenous lobulated fibroid uterus. - On reassessment patient sleeping comfortably no acute distress.  Patient states understanding of findings above.  Suspect patient symptoms today secondary to fibroid pain and dysmenorrhea.  Pain controlled following Toradol today.  On reassessment she is well-appearing no acute distress she will follow-up with her OB/GYN and PCP for reevaluation this week.  Additionally RPR and GC chlamydia are pending, shared decision making with patient, suspicion for STI or PID at this time.  Patient will follow up on her test via her MyChart account and discuss those results with her PCP and OB/GYN if treatment is necessary at her follow-up visit.  Patient has ride home from the  ED, states understanding not to drive today due to narcotics given today.  At this time there does not appear  to be any evidence of an acute emergency medical condition and the patient appears stable for discharge with appropriate outpatient follow up. Diagnosis was discussed with patient who verbalizes understanding of care plan and is agreeable to discharge. I have discussed return precautions with patient who verbalizes understanding of return precautions. Patient encouraged to follow-up with their PCP and OBGYN. All questions answered.  Patient's case discussed with Dr. Maryan Rued who agrees with plan to discharge with follow-up.   Note: Portions of this report may have been transcribed using voice recognition software. Every effort was made to ensure accuracy; however, inadvertent computerized transcription errors may still be present. Final Clinical Impression(s) / ED Diagnoses Final diagnoses:  Dysmenorrhea  Uterine leiomyoma, unspecified location  Lower abdominal pain    Rx / DC Orders ED Discharge Orders    None       Gari Crown 12/26/19 2154    Deliah Boston, PA-C 12/26/19 2157    Blanchie Dessert, MD 12/27/19 1052

## 2019-12-26 NOTE — Discharge Instructions (Addendum)
You have been diagnosed today with dysmenorrhea, fibroids.  At this time there does not appear to be the presence of an emergent medical condition, however there is always the potential for conditions to change. Please read and follow the below instructions.  Please return to the Emergency Department immediately for any new or worsening symptoms. Please be sure to follow up with your Primary Care Provider within one week regarding your visit today; please call their office to schedule an appointment even if you are feeling better for a follow-up visit. You have been given an NSAID-containing medication called Toradol today.  Do not take the medications including ibuprofen, Aleve, Advil, naproxen or other NSAID-containing medications for the next 2 days.  Please be sure to drink plenty of water over the next few days. Your STI tests are pending at this time, please check your MyChart account for the results in 1-2 days.  Please discuss all results with your primary care provider and your OB/GYN at your follow-up visit this week.  Get help right away if: Your pain does not go away as soon as your doctor says it should. You cannot stop vomiting. Your pain is only in areas of your belly, such as the right side or the left lower part of the belly. You have bloody or black poop, or poop that looks like tar. You have very bad pain, cramping, or bloating in your belly. You have signs of not having enough fluid or water in your body (dehydration), such as: Dark pee, very little pee, or no pee. Cracked lips. Dry mouth. Sunken eyes. Sleepiness. Weakness. You have trouble breathing or chest pain. You pass out (faint). You have any new/concerning or worsening of symptoms  Please read the additional information packets attached to your discharge summary.  Do not take your medicine if  develop an itchy rash, swelling in your mouth or lips, or difficulty breathing; call 911 and seek immediate emergency  medical attention if this occurs.  Note: Portions of this text may have been transcribed using voice recognition software. Every effort was made to ensure accuracy; however, inadvertent computerized transcription errors may still be present.

## 2019-12-26 NOTE — ED Triage Notes (Signed)
Pt to triage via [facility] from home.  Reports lower abd pain since this morning.  Pt has history of endometriosis and states pain is not improved with pain medication.  Vaginal bleeding since 1/3 that is heavy since yesterday.  C/o nausea.

## 2019-12-26 NOTE — ED Notes (Signed)
Culture sent down with urine.  

## 2019-12-26 NOTE — Progress Notes (Signed)
Patient did not keep her GYN appointment for 12/25/2019.  Durene Romans MD Attending Center for [employer])

## 2019-12-26 NOTE — ED Notes (Signed)
Pt verbalized understanding of discharge instructions. Follow up care and pain management reviewed. Pt ambulated independently to lobby

## 2019-12-27 LAB — GC/CHLAMYDIA PROBE AMP (~~LOC~~) NOT AT ARMC
Chlamydia: NEGATIVE
Neisseria Gonorrhea: NEGATIVE

## 2019-12-27 LAB — RPR: RPR Ser Ql: NONREACTIVE

## 2020-01-10 ENCOUNTER — Other Ambulatory Visit: Payer: Self-pay

## 2020-01-10 ENCOUNTER — Other Ambulatory Visit (HOSPITAL_COMMUNITY)
Admission: RE | Admit: 2020-01-10 | Discharge: 2020-01-10 | Disposition: A | Discharge status: Home / Self Care | Payer: Medicaid Other | Source: Ambulatory Visit | Attending: Obstetrics and Gynecology | Admitting: Obstetrics and Gynecology

## 2020-01-10 ENCOUNTER — Ambulatory Visit (INDEPENDENT_AMBULATORY_CARE_PROVIDER_SITE_OTHER): Payer: Medicaid Other | Admitting: Obstetrics and Gynecology

## 2020-01-10 ENCOUNTER — Encounter: Payer: Self-pay | Admitting: Obstetrics and Gynecology

## 2020-01-10 VITALS — BP 134/93 | HR 89 | Wt 150.8 lb

## 2020-01-10 DIAGNOSIS — N939 Abnormal uterine and vaginal bleeding, unspecified: Secondary | ICD-10-CM | POA: Diagnosis not present

## 2020-01-10 DIAGNOSIS — N946 Dysmenorrhea, unspecified: Secondary | ICD-10-CM | POA: Insufficient documentation

## 2020-01-10 DIAGNOSIS — N920 Excessive and frequent menstruation with regular cycle: Secondary | ICD-10-CM | POA: Insufficient documentation

## 2020-01-10 DIAGNOSIS — Z3042 Encounter for surveillance of injectable contraceptive: Secondary | ICD-10-CM

## 2020-01-10 DIAGNOSIS — N840 Polyp of corpus uteri: Secondary | ICD-10-CM | POA: Diagnosis not present

## 2020-01-10 HISTORY — PX: ENDOMETRIAL BIOPSY: PRO73

## 2020-01-10 LAB — POCT PREGNANCY, URINE: Preg Test, Ur: NEGATIVE

## 2020-01-10 MED ORDER — MEDROXYPROGESTERONE ACETATE 150 MG/ML IM SUSP
150.0000 mg | Freq: Once | INTRAMUSCULAR | Status: AC
  Administered 2020-01-10: 150 mg via INTRAMUSCULAR

## 2020-01-10 MED ORDER — MEDROXYPROGESTERONE ACETATE 10 MG PO TABS: ORAL_TABLET | ORAL | 1 refills | Status: DC

## 2020-01-10 MED ORDER — IBUPROFEN 200 MG PO TABS
800.0000 mg | ORAL_TABLET | Freq: Once | ORAL | Status: AC
  Administered 2020-01-10: 800 mg via ORAL

## 2020-01-10 NOTE — Progress Notes (Signed)
Obstetrics and Gynecology Established Patient Evaluation  Appointment Date: 01/10/2020  OBGYN Clinic: [facility]  Primary Care Provider: Patient, No Pcp Per  Referring Provider: No ref. provider found  Chief Complaint: follow up AUB, painful and heavy periods  History of Present Illness: Diana Butler is a 39 y.o. African-American [id number] (No LMP recorded.), seen for the above chief complaint. Her past medical history is significant for ex-lap x 2 for a right and left fallopian tubes, fibroids, HTN, endometriosis, tobacco abuse.   Patient last seen by me in November 2020 for same issues, with patient wanting to preserve child bearing as an option in the future. Unfortunately, at that time, patient didn't have insurance coverage so plan moving forward was for her to apply for coverage and to do provera with her periods.    Patient states she took the provera incorrectly was doing it qday and not with her periods. She most recently was in the ED on 1/19 for same issues, no provera available, and had negative CT scan and u/s that was stable with submucosal fibroid looking a little smaller (see below). Pt had negative wet prep, hiv, rpr, cbc, cmp, lipase, gc/ct   Patient not currently having bleeding  Review of Systems: Pertinent items noted in HPI and remainder of comprehensive ROS otherwise negative.   Patient Active Problem List   Diagnosis Date Noted  . Submucous uterine fibroid 10/24/2019  . Abnormal uterine bleeding (AUB) 10/24/2019  . Anemia 02/21/2018  . Fibroid 07/22/2017  . Dysmenorrhea 07/22/2017  . Tobacco abuse 07/22/2017  . Depression 07/22/2017  . Menorrhagia with regular cycle 07/22/2017    Past Medical History:  Past Medical History:  Diagnosis Date  . Endometriosis    per patient report.   . Hypertension     Past Surgical History:  Past Surgical History:  Procedure Laterality Date  . ENDOMETRIAL BIOPSY  01/10/2020      .  SALPINGECTOMY     right, ectopic. pt states had l/s and ex-lap  . SALPINGECTOMY     left, ectopic. pt states had l/s and ex-lap  . TONSILLECTOMY     age 28    Past Obstetrical History:  OB History  Gravida Para Term Preterm AB Living  2       2 0  SAB TAB Ectopic Multiple Live Births      2        # Outcome Date GA Lbr Len/2nd Weight Sex Delivery Anes PTL Lv  2 Ectopic           1 Ectopic             Past Gynecological History: As per HPI. Patient states that she has has bleeding that isn't too heavy or painful about a week before her periods and then she has a period that is heavy and painful and lasts for about a week. She has a period [place].   Social History:  Social History   Socioeconomic History  . Marital status: Single    Spouse name: Not on file  . Number of children: Not on file  . Years of education: Not on file  . Highest education level: Not on file  Occupational History  . Not on file  Tobacco Use  . Smoking status: Current Every Day Smoker    Packs/day: 0.50    Years: 12.00    Pack years: 6.00    Types: Cigarettes  . Smokeless tobacco: Never Used  Substance and Sexual  Activity  . Alcohol use: Yes    Comment: occ  . Drug use: No  . Sexual activity: Yes    Birth control/protection: Surgical  Other Topics Concern  . Not on file  Social History Narrative  . Not on file   Social Determinants of Health   Financial Resource Strain:   . Difficulty of Paying Living Expenses: Not on file  Food Insecurity:   . Worried About Charity fundraiser in the Last Year: Not on file  . Ran Out of Food in the Last Year: Not on file  Transportation Needs:   . Lack of Transportation (Medical): Not on file  . Lack of Transportation (Non-Medical): Not on file  Physical Activity:   . Days of Exercise per Week: Not on file  . Minutes of Exercise per Session: Not on file  Stress:   . Feeling of Stress : Not on file  Social Connections:   . Frequency of  Communication with Friends and Family: Not on file  . Frequency of Social Gatherings with Friends and Family: Not on file  . Attends Religious Services: Not on file  . Active Member of Clubs or Organizations: Not on file  . Attends Archivist Meetings: Not on file  . Marital Status: Not on file  Intimate Partner Violence:   . Fear of Current or Ex-Partner: Not on file  . Emotionally Abused: Not on file  . Physically Abused: Not on file  . Sexually Abused: Not on file    Family History: No family history on file.  Medications Diana Butler had no medications administered during this visit. Current Outpatient Medications  Medication Sig Dispense Refill  . naproxen (NAPROSYN) 500 MG tablet Take 1 tablet (500 mg total) by mouth 2 (two) times daily. 30 tablet 0  . ferrous sulfate 325 (65 FE) MG tablet Take 1 tablet (325 mg total) by mouth 2 (two) times daily with a meal. TAKE WITH ORANGE JUICE (Patient not taking: Reported on 10/18/2019) 60 tablet 2  . FLUoxetine (PROZAC) 10 MG capsule One tab po qday x1wk. Then two tabs po qday thereafter. (Patient not taking: Reported on 10/18/2019) 60 capsule 1  . naproxen (NAPROSYN) 500 MG tablet Take 1 tablet (500 mg total) by mouth 2 (two) times daily with a meal. (Patient not taking: Reported on 12/26/2019) 60 tablet 2  . ondansetron (ZOFRAN ODT) 4 MG disintegrating tablet 4mg  ODT q4 hours prn nausea/vomit (Patient not taking: Reported on 12/26/2019) 10 tablet 0   No current facility-administered medications for this visit.    Allergies Patient has no known allergies.   Physical Exam:  BP (!) 134/93   Pulse 89   Wt 150 lb 12.8 oz (68.4 kg)   BMI 4076.28 kg/m  Body mass index is 4,076.28 kg/m. General appearance: Well nourished, well developed female in no acute distress.  Cardiovascular: normal s1 and s2.  No murmurs, rubs or gallops. Respiratory:  Clear to auscultation bilateral. Normal respiratory effort Abdomen: positive bowel  sounds and no masses, hernias; diffusely non tender to palpation, non distended Neuro/Psych:  Normal mood and affect.  Skin:  Warm and dry.  Lymphatic:  No inguinal lymphadenopathy.   Pelvic exam: is not limited by body habitus EGBUS: within normal limits Vagina: within normal limits and with no blood or discharge in the vault Cervix: normal appearing cervix without tenderness, discharge or lesions.  Uterus:  nonenlarged and non tender, +mobility Adnexa:  normal adnexa and no mass, fullness, tenderness Rectovaginal:  deferred  See procedure note for endometrial biopsy  Laboratory: as per HPI  Radiology:  CLINICAL DATA:  Pelvic pain  EXAM: TRANSABDOMINAL AND TRANSVAGINAL ULTRASOUND OF PELVIS  DOPPLER ULTRASOUND OF OVARIES  TECHNIQUE: Both transabdominal and transvaginal ultrasound examinations of the pelvis were performed. Transabdominal technique was performed for global imaging of the pelvis including uterus, ovaries, adnexal regions, and pelvic cul-de-sac.  It was necessary to proceed with endovaginal exam following the transabdominal exam to visualize the uterus endometrium ovaries. Color and duplex Doppler ultrasound was utilized to evaluate blood flow to the ovaries.  COMPARISON:  Pelvic ultrasound 10/18/2019, 01/09/2018, 06/18/2017  FINDINGS: Uterus  Measurements: 8.5 x 4.7 x 6.1 cm = volume: 128.9 mL. Heterogeneous echotexture throughout. Multiple fibroids. Exophytic anterior fundal fibroid measures 9 x 9 x 11 mm. Sub mucosal right anterior fundal fibroid measures 1 x 0.9 x 0.8 cm. Heterogenous submucosal fibroid measures 2.4 x 1.6 x 2.6 cm.  Endometrium  Thickness: 11 mm.  No focal abnormality visualized.  Right ovary  Measurements: 3 x 1.8 x 2 cm = volume: 5.7 mL. Normal appearance/no adnexal mass.  Left ovary  Measurements: 3.3 x 2.1 x 2.7 cm = volume: 9.7 mL. Normal appearance/no adnexal mass.  Pulsed Doppler evaluation of both  ovaries demonstrates normal low-resistance arterial and venous waveforms.  Other findings  Small free fluid in the right adnexa.  IMPRESSION: 1. Negative for ovarian torsion. 2. Heterogenous uterus with multiple fibroids. 3. Small amount of right adnexal fluid.   Electronically Signed   By: Donavan Foil M.D.   On: 12/26/2019 17:34  CLINICAL DATA:  Right lower quadrant pain  EXAM: CT ABDOMEN AND PELVIS WITH CONTRAST  TECHNIQUE: Multidetector CT imaging of the abdomen and pelvis was performed using the standard protocol following bolus administration of intravenous contrast.  CONTRAST:  175mL OMNIPAQUE IOHEXOL 300 MG/ML  SOLN  COMPARISON:  06/18/2017 CT  FINDINGS: Lower chest: No acute abnormality.  Hepatobiliary: No focal liver abnormality is seen. No gallstones, gallbladder wall thickening, or biliary dilatation.  Pancreas: Unremarkable. No pancreatic ductal dilatation or surrounding inflammatory changes.  Spleen: Normal in size without focal abnormality.  Adrenals/Urinary Tract: Adrenal glands are unremarkable. Kidneys are normal, without renal calculi, focal lesion, or hydronephrosis. Bladder is unremarkable.  Stomach/Bowel: Stomach is within normal limits. Appendix appears normal. No evidence of bowel wall thickening, distention, or inflammatory changes.  Vascular/Lymphatic: No significant vascular findings are present. No enlarged abdominal or pelvic lymph nodes.  Reproductive: Heterogenous lobulated uterus with multiple hypoenhancing masses corresponding to fibroids seen on ultrasound. No adnexal mass.  Other: No abdominal wall hernia or abnormality. No abdominopelvic ascites.  Musculoskeletal: No acute or significant osseous findings.  IMPRESSION: 1. No CT evidence for acute intra-abdominal or pelvic abnormality. 2. Heterogenous lobulated fibroid uterus.   Electronically Signed   By: Donavan Foil M.D.   On: 12/26/2019  19:37  Assessment: pt stable  Plan:  1. Menorrhagia with regular cycle Basic lab work up today. Will get a1c since some sugars on metabolic panels have been a little elevated.   I d/w her re: medical options (depo provera, mirena IUD) and surgical (hysterectomy, hysteroscopy, removal of submucosal fibroid, d&c, ablation). I told her that progestins seem like a better options since the fibroid looks smaller and efficacy of all options d/w her. pt would like to do ablation. She is amenable to depo provera to help prep lining and I told her that if depo is working then can cancel surgery and pt is amenable  to this plan.   Provera sent in in the interim to help with periods. If doesn't help, pt told to let us know as can try lysteda. Instructions on how to use provera d/w her  - TSH - Cytology - PAP - Surgical pathology( [place]/ [facility]) - Hemoglobin A1c  2. Dysmenorrhea - TSH - Cytology - PAP - Surgical pathology( [place]/ [facility]) - Hemoglobin A1c  3. Abnormal uterine bleeding (AUB) - TSH - Cytology - PAP - Surgical pathology( [place]/ [facility]) - Hemoglobin A1c  Orders Placed This Encounter  Procedures  . TSH  . Hemoglobin A1c  . Pregnancy, urine POC    RTC about 2-3wks before surgery.   Durene Romans MD Attending [facility] for [facility] [employer] farm [facility])

## 2020-01-10 NOTE — Procedures (Signed)
Endometrial Biopsy Procedure Note  Pre-operative Diagnosis: AUB. Dysmenorrhea, menorrhagia, fibroids  Post-operative Diagnosis: same  Procedure Details  Urine pregnancy test was done and result was negative.  The risks (including infection, bleeding, pain, and uterine perforation) and benefits of the procedure were explained to the patient and Written informed consent was obtained.  The patient was placed in the dorsal lithotomy position.  Bimanual exam showed the uterus to be in the neutral position.  A Graves' speculum inserted in the vagina, and the cervix was visualized and a pap smear performed. The cervix was then prepped with povidone iodine, and a sharp tenaculum was applied to the anterior lip of the cervix for stabilization.  A pipelle was inserted into the uterine cavity and sounded the uterus to a depth of 7.5cm.  A Small amount of tissue was collected after 2 passes. The sample was sent for pathologic examination.  Condition: Stable  Complications: None  Plan: The patient was advised to call for any fever or for prolonged or severe pain or bleeding. She was advised to use OTC analgesics as needed for mild to moderate pain. She was advised to avoid vaginal intercourse for 48 hours or until the bleeding has completely stopped.  Durene Romans MD Attending [facility])

## 2020-01-10 NOTE — Progress Notes (Signed)
Follow up heavy periods / fibroids

## 2020-01-11 LAB — SURGICAL PATHOLOGY

## 2020-01-11 LAB — TSH: TSH: 2.32 u[IU]/mL (ref 0.450–4.500)

## 2020-01-11 LAB — HEMOGLOBIN A1C
Est. average glucose Bld gHb Est-mCnc: 94 mg/dL
Hgb A1c MFr Bld: 4.9 % (ref 4.8–5.6)

## 2020-01-15 ENCOUNTER — Telehealth: Payer: Self-pay

## 2020-01-15 ENCOUNTER — Telehealth: Payer: Self-pay | Admitting: General Practice

## 2020-01-15 LAB — CYTOLOGY - PAP
Adequacy: ABSENT
Comment: NEGATIVE
Comment: NEGATIVE
Comment: NEGATIVE
Diagnosis: NEGATIVE
HPV 16: NEGATIVE
HPV 18 / 45: NEGATIVE
High risk HPV: POSITIVE — AB

## 2020-01-15 NOTE — Telephone Encounter (Signed)
Patient called and left message on nurse voicemail line stating she is returning our phone call. Called patient and reviewed mychart message sent to her earlier. Patient verbalized understanding and states she only has [place] medicaid. Recommended [facility] or [facility] for primary care. Also recommended she come by our office to complete mammogram scholarship application. Patient verbalized understanding to all & had no questions.

## 2020-01-15 NOTE — Telephone Encounter (Addendum)
Per Ilda Basset, MD pt now has Medicaid and needs follow up with a PCP. Ilda Basset, MD would also like patient to be offered a mammogram. Per chart review pt has [facility], will follow up with patient about current insurance.  Called pt; VM left stating I was following up for Dr. Ilda Basset and encouraged pt to call the office or respond to [place] message. MyChart message sent.

## 2020-01-16 NOTE — Telephone Encounter (Signed)
Called pt; VM left requesting pt to call the office or respond to my MyChart message if she would be interested in establishing care with a PCP or having a mammogram done.

## 2020-01-31 ENCOUNTER — Encounter (HOSPITAL_BASED_OUTPATIENT_CLINIC_OR_DEPARTMENT_OTHER): Payer: Self-pay | Admitting: Obstetrics and Gynecology

## 2020-01-31 ENCOUNTER — Other Ambulatory Visit: Payer: Self-pay

## 2020-02-03 ENCOUNTER — Other Ambulatory Visit (HOSPITAL_COMMUNITY)
Admission: RE | Admit: 2020-02-03 | Discharge: 2020-02-03 | Disposition: A | Discharge status: Home / Self Care | Payer: 59 | Source: Ambulatory Visit | Attending: Obstetrics and Gynecology | Admitting: Obstetrics and Gynecology

## 2020-02-03 DIAGNOSIS — Z20822 Contact with and (suspected) exposure to covid-19: Secondary | ICD-10-CM | POA: Diagnosis not present

## 2020-02-03 DIAGNOSIS — Z01812 Encounter for preprocedural laboratory examination: Secondary | ICD-10-CM | POA: Insufficient documentation

## 2020-02-03 LAB — SARS CORONAVIRUS 2 (TAT 6-24 HRS): SARS Coronavirus 2: NEGATIVE

## 2020-02-05 ENCOUNTER — Other Ambulatory Visit: Payer: Self-pay | Admitting: Obstetrics and Gynecology

## 2020-02-07 ENCOUNTER — Ambulatory Visit (HOSPITAL_BASED_OUTPATIENT_CLINIC_OR_DEPARTMENT_OTHER): Payer: 59 | Admitting: Anesthesiology

## 2020-02-07 ENCOUNTER — Encounter: Payer: Self-pay | Admitting: Obstetrics and Gynecology

## 2020-02-07 ENCOUNTER — Ambulatory Visit (HOSPITAL_BASED_OUTPATIENT_CLINIC_OR_DEPARTMENT_OTHER)
Admission: RE | Admit: 2020-02-07 | Discharge: 2020-02-07 | Disposition: A | Discharge status: Home / Self Care | Payer: 59 | Attending: Obstetrics and Gynecology | Admitting: Obstetrics and Gynecology

## 2020-02-07 ENCOUNTER — Encounter (HOSPITAL_BASED_OUTPATIENT_CLINIC_OR_DEPARTMENT_OTHER): Payer: Self-pay | Admitting: Obstetrics and Gynecology

## 2020-02-07 ENCOUNTER — Encounter (HOSPITAL_BASED_OUTPATIENT_CLINIC_OR_DEPARTMENT_OTHER)
Admission: RE | Disposition: A | Discharge status: Home / Self Care | Payer: Self-pay | Source: Home / Self Care | Attending: Obstetrics and Gynecology

## 2020-02-07 ENCOUNTER — Other Ambulatory Visit: Payer: Self-pay

## 2020-02-07 DIAGNOSIS — Z79899 Other long term (current) drug therapy: Secondary | ICD-10-CM | POA: Insufficient documentation

## 2020-02-07 DIAGNOSIS — Z791 Long term (current) use of non-steroidal anti-inflammatories (NSAID): Secondary | ICD-10-CM | POA: Insufficient documentation

## 2020-02-07 DIAGNOSIS — D250 Submucous leiomyoma of uterus: Secondary | ICD-10-CM | POA: Insufficient documentation

## 2020-02-07 DIAGNOSIS — B977 Papillomavirus as the cause of diseases classified elsewhere: Secondary | ICD-10-CM | POA: Insufficient documentation

## 2020-02-07 DIAGNOSIS — Z793 Long term (current) use of hormonal contraceptives: Secondary | ICD-10-CM | POA: Insufficient documentation

## 2020-02-07 DIAGNOSIS — Z87891 Personal history of nicotine dependence: Secondary | ICD-10-CM | POA: Diagnosis not present

## 2020-02-07 DIAGNOSIS — Z9079 Acquired absence of other genital organ(s): Secondary | ICD-10-CM | POA: Insufficient documentation

## 2020-02-07 DIAGNOSIS — I10 Essential (primary) hypertension: Secondary | ICD-10-CM | POA: Insufficient documentation

## 2020-02-07 DIAGNOSIS — N946 Dysmenorrhea, unspecified: Secondary | ICD-10-CM | POA: Insufficient documentation

## 2020-02-07 DIAGNOSIS — N920 Excessive and frequent menstruation with regular cycle: Secondary | ICD-10-CM | POA: Insufficient documentation

## 2020-02-07 DIAGNOSIS — N944 Primary dysmenorrhea: Secondary | ICD-10-CM

## 2020-02-07 DIAGNOSIS — D259 Leiomyoma of uterus, unspecified: Secondary | ICD-10-CM | POA: Diagnosis present

## 2020-02-07 DIAGNOSIS — N840 Polyp of corpus uteri: Secondary | ICD-10-CM | POA: Diagnosis not present

## 2020-02-07 DIAGNOSIS — Z9889 Other specified postprocedural states: Secondary | ICD-10-CM

## 2020-02-07 HISTORY — DX: Anemia, unspecified: D64.9

## 2020-02-07 HISTORY — PX: DILITATION & CURRETTAGE/HYSTROSCOPY WITH HYDROTHERMAL ABLATION: SHX5570

## 2020-02-07 LAB — POCT PREGNANCY, URINE: Preg Test, Ur: NEGATIVE

## 2020-02-07 SURGERY — DILATATION & CURETTAGE/HYSTEROSCOPY WITH HYDROTHERMAL ABLATION
Anesthesia: General | Site: Vagina

## 2020-02-07 MED ORDER — MIDAZOLAM HCL 5 MG/5ML IJ SOLN
INTRAMUSCULAR | Status: DC | PRN
  Administered 2020-02-07: 2 mg via INTRAVENOUS

## 2020-02-07 MED ORDER — PROPOFOL 10 MG/ML IV BOLUS
INTRAVENOUS | Status: DC | PRN
  Administered 2020-02-07: 200 mg via INTRAVENOUS

## 2020-02-07 MED ORDER — LACTATED RINGERS IV SOLN: INTRAVENOUS | Status: DC

## 2020-02-07 MED ORDER — PROPOFOL 10 MG/ML IV BOLUS
INTRAVENOUS | Status: AC
  Filled 2020-02-07: qty 20

## 2020-02-07 MED ORDER — LIDOCAINE 2% (20 MG/ML) 5 ML SYRINGE
INTRAMUSCULAR | Status: AC
  Filled 2020-02-07: qty 5

## 2020-02-07 MED ORDER — SILVER NITRATE-POT NITRATE 75-25 % EX MISC
CUTANEOUS | Status: DC | PRN
  Administered 2020-02-07: 2

## 2020-02-07 MED ORDER — ONDANSETRON HCL 4 MG/2ML IJ SOLN
INTRAMUSCULAR | Status: AC
  Filled 2020-02-07: qty 2

## 2020-02-07 MED ORDER — OXYCODONE HCL 5 MG/5ML PO SOLN: 5.0000 mg | Freq: Once | ORAL | Status: AC | PRN

## 2020-02-07 MED ORDER — LIDOCAINE HCL (PF) 1 % IJ SOLN
INTRAMUSCULAR | Status: AC
  Filled 2020-02-07: qty 30

## 2020-02-07 MED ORDER — DEXAMETHASONE SODIUM PHOSPHATE 10 MG/ML IJ SOLN
INTRAMUSCULAR | Status: AC
  Filled 2020-02-07: qty 1

## 2020-02-07 MED ORDER — DIPHENHYDRAMINE HCL 50 MG/ML IJ SOLN: 12.5000 mg | Freq: Once | INTRAMUSCULAR | Status: DC

## 2020-02-07 MED ORDER — OXYCODONE HCL 5 MG PO TABS
ORAL_TABLET | ORAL | Status: AC
  Filled 2020-02-07: qty 1

## 2020-02-07 MED ORDER — FENTANYL CITRATE (PF) 100 MCG/2ML IJ SOLN
25.0000 ug | INTRAMUSCULAR | Status: DC | PRN
  Administered 2020-02-07: 50 ug via INTRAVENOUS

## 2020-02-07 MED ORDER — OXYCODONE-ACETAMINOPHEN 5-325 MG PO TABS: 1.0000 | ORAL_TABLET | Freq: Four times a day (QID) | ORAL | 0 refills | Status: DC | PRN

## 2020-02-07 MED ORDER — FENTANYL CITRATE (PF) 100 MCG/2ML IJ SOLN
INTRAMUSCULAR | Status: AC
  Filled 2020-02-07: qty 2

## 2020-02-07 MED ORDER — LIDOCAINE HCL (PF) 1 % IJ SOLN
INTRAMUSCULAR | Status: DC | PRN
  Administered 2020-02-07: 20 mL

## 2020-02-07 MED ORDER — DEXAMETHASONE SODIUM PHOSPHATE 10 MG/ML IJ SOLN
INTRAMUSCULAR | Status: DC | PRN
  Administered 2020-02-07: 5 mg via INTRAVENOUS

## 2020-02-07 MED ORDER — LIDOCAINE 2% (20 MG/ML) 5 ML SYRINGE
INTRAMUSCULAR | Status: DC | PRN
  Administered 2020-02-07: 80 mg via INTRAVENOUS

## 2020-02-07 MED ORDER — PROMETHAZINE HCL 25 MG/ML IJ SOLN: 6.2500 mg | INTRAMUSCULAR | Status: DC | PRN

## 2020-02-07 MED ORDER — PROPOFOL 500 MG/50ML IV EMUL
INTRAVENOUS | Status: AC
  Filled 2020-02-07: qty 50

## 2020-02-07 MED ORDER — DIPHENHYDRAMINE HCL 50 MG/ML IJ SOLN
INTRAMUSCULAR | Status: AC
  Filled 2020-02-07: qty 1

## 2020-02-07 MED ORDER — ACETAMINOPHEN 10 MG/ML IV SOLN: 1000.0000 mg | Freq: Once | INTRAVENOUS | Status: DC | PRN

## 2020-02-07 MED ORDER — MIDAZOLAM HCL 2 MG/2ML IJ SOLN
INTRAMUSCULAR | Status: AC
  Filled 2020-02-07: qty 2

## 2020-02-07 MED ORDER — OXYCODONE HCL 5 MG PO TABS
5.0000 mg | ORAL_TABLET | Freq: Once | ORAL | Status: AC | PRN
  Administered 2020-02-07: 16:00:00 5 mg via ORAL

## 2020-02-07 MED ORDER — FENTANYL CITRATE (PF) 100 MCG/2ML IJ SOLN
INTRAMUSCULAR | Status: DC | PRN
  Administered 2020-02-07 (×2): 50 ug via INTRAVENOUS
  Administered 2020-02-07: 100 ug via INTRAVENOUS

## 2020-02-07 MED ORDER — SILVER NITRATE-POT NITRATE 75-25 % EX MISC
CUTANEOUS | Status: AC
  Filled 2020-02-07: qty 10

## 2020-02-07 MED ORDER — ONDANSETRON HCL 4 MG/2ML IJ SOLN
INTRAMUSCULAR | Status: DC | PRN
  Administered 2020-02-07: 4 mg via INTRAVENOUS

## 2020-02-07 SURGICAL SUPPLY — 19 items
CATH ROBINSON RED A/P 16FR (CATHETERS) ×3 IMPLANT
DILATOR CANAL MILEX (MISCELLANEOUS) IMPLANT
GAUZE 4X4 16PLY RFD (DISPOSABLE) ×3 IMPLANT
GLOVE BIOGEL PI IND STRL 7.0 (GLOVE) ×1 IMPLANT
GLOVE BIOGEL PI IND STRL 7.5 (GLOVE) ×1 IMPLANT
GLOVE BIOGEL PI INDICATOR 7.0 (GLOVE) ×2
GLOVE BIOGEL PI INDICATOR 7.5 (GLOVE) ×2
GLOVE SURG SS PI 7.0 STRL IVOR (GLOVE) ×3 IMPLANT
GOWN STRL REUS W/TWL LRG LVL3 (GOWN DISPOSABLE) ×3 IMPLANT
GOWN STRL REUS W/TWL XL LVL3 (GOWN DISPOSABLE) ×3 IMPLANT
MYOSURE XL FIBROID (MISCELLANEOUS) ×3
PACK VAGINAL MINOR WOMEN LF (CUSTOM PROCEDURE TRAY) ×3 IMPLANT
PAD OB MATERNITY 4.3X12.25 (PERSONAL CARE ITEMS) ×3 IMPLANT
PAD PREP 24X48 CUFFED NSTRL (MISCELLANEOUS) ×3 IMPLANT
SEAL ROD LENS SCOPE MYOSURE (ABLATOR) ×3 IMPLANT
SET GENESYS HTA PROCERVA (MISCELLANEOUS) ×3 IMPLANT
SLEEVE SCD COMPRESS KNEE MED (MISCELLANEOUS) ×6 IMPLANT
SYSTEM TISS REMOVAL MYOSURE XL (MISCELLANEOUS) ×1 IMPLANT
TOWEL GREEN STERILE FF (TOWEL DISPOSABLE) ×3 IMPLANT

## 2020-02-07 NOTE — Discharge Instructions (Addendum)
 We will discuss your surgery once again in detail at your post-op visit in two to four weeks. If you haven't already done so, please call to make your appointment as soon as possible.  These instructions give you information on caring for yourself after your procedure. Your doctor may also give you more specific instructions. Call your doctor if you have any problems or questions after your procedure. HOME CARE Do not drive for 24 hours. Wait 1 week before doing any activities that wear you out. Do not stand for a long time. Limit stair climbing to once or twice a day. Rest often. Continue with your usual diet. Drink enough fluids to keep your pee (urine) clear or pale yellow. If you have a hard time pooping (constipation), you may: Take a medicine to help you go poop (laxative) as told by your doctor. Eat more fruit and bran. Drink more fluids. Take showers, not baths, for as long as told by your doctor. Do not swim or use a hot tub until your doctor says it is okay. Have someone with you for 1day after the procedure. Do not douche, use tampons, or have sex (intercourse) until seen by your doctor Only take medicines as told by your doctor. Do not take aspirin. It can cause bleeding. Keep all doctor visits. GET HELP IF: You have cramps or pain not helped by medicine. You have new pain in the belly (abdomen). You have a bad smelling fluid coming from your vagina. You have a rash. You have problems with any medicine. GET HELP RIGHT AWAY IF:  You start to bleed more than a regular period. You have a fever. You have chest pain. You have trouble breathing. You feel dizzy or feel like passing out (fainting). You pass out. You have pain in the tops of your shoulders. You have vaginal bleeding with or without clumps of blood (blood clots). MAKE SURE YOU: Understand these instructions. Will watch your condition. Will get help right away if you are not doing well or get  worse. Document Released: 09/01/2008 Document Revised: 11/28/2013 Document Reviewed: 06/22/2013 ExitCare Patient Information 2015 [employer]. This information is not intended to replace advice given to you by your health care provider. Make sure you discuss any questions you have with your health care provider.   Post Anesthesia Home Care Instructions  Activity: Get plenty of rest for the remainder of the day. A responsible individual must stay with you for 24 hours following the procedure.  For the next 24 hours, DO NOT: -Drive a car -Operate machinery -Drink alcoholic beverages -Take any medication unless instructed by your physician -Make any legal decisions or sign important papers.  Meals: Start with liquid foods such as gelatin or soup. Progress to regular foods as tolerated. Avoid greasy, spicy, heavy foods. If nausea and/or vomiting occur, drink only clear liquids until the nausea and/or vomiting subsides. Call your physician if vomiting continues.  Special Instructions/Symptoms: Your throat may feel dry or sore from the anesthesia or the breathing tube placed in your throat during surgery. If this causes discomfort, gargle with warm salt water. The discomfort should disappear within 24 hours.  If you had a scopolamine patch placed behind your ear for the management of post- operative nausea and/or vomiting:  1. The medication in the patch is effective for 72 hours, after which it should be removed.  Wrap patch in a tissue and discard in the trash. Wash hands thoroughly with soap and water. 2. You may   remove the patch earlier than 72 hours if you experience unpleasant side effects which may include dry mouth, dizziness or visual disturbances. 3. Avoid touching the patch. Wash your hands with soap and water after contact with the patch.     

## 2020-02-07 NOTE — Op Note (Signed)
Operative Note   02/07/2020  PRE-OP DIAGNOSIS: Dysmenorrhea. Menorrhagia. Fibroids. Polyps   POST-OP DIAGNOSIS: Same   SURGEON: Surgeon(s) and Role:    * Aletha Halim, MD - Primary  ASSISTANT: None  PROCEDURE: Hysteroscopy, Myosure polypectomy and myomectomy, Hydrothermal ablation  ANESTHESIA: General and paracervical block  ESTIMATED BLOOD LOSS: 43mL  DRAINS: I/O cath per anesthesia note   TOTAL IV FLUIDS: per anesthesia note  SPECIMENS: None  VTE PROPHYLAXIS: SCDs to the bilateral lower extremities  ANTIBIOTICS: Not indicated  FLUID DEFICIT: [id number]  COMPLICATIONS: None  DISPOSITION: PACU - hemodynamically stable.  CONDITION: stable  FINDINGS: Exam under anesthesia revealed small, mobile midplane uterus with no masses and bilateral adnexa without masses or fullness. Hysteroscopy revealed several polyps diffusely scattered throughout the uterine cavity. There was also a posterior, 2cm submucosal fibroid (type 2). Bilateral ostia not seen  After Myosure, no polyps seen, fibroid near flat with uterine wall. After ablation, uniform ablation of intrauterine cavity.  PROCEDURE IN DETAIL:  After informed consent was obtained, the patient was taken to the operating room where anesthesia was obtained without difficulty. The patient was positioned in the dorsal lithotomy position in [place].  The patient's bladder was catheterized with an in and out foley catheter.  The patient was examined under anesthesia, with the above noted findings.  The bi-valved speculum was placed inside the patient's vagina, and the the anterior lip of the cervix was seen and grasped with the tenaculum.  A paracervical block was achieved with 6mL of 1% lidocaine.  The uterine cavity was sounded to 7.5cm, and then the cervix was progressively dilated to a 19 French-Pratt dilator.  The external os is small and nulliparous so another tenaculum was placed posteriorly.  The hysteroscope was introduced,  with the above noted findings.  Using the large myosure blade, the polypectomy and myomectomy was then done. The hysteroscope was removed and the HTA hysteroscope then introduced.  The HTA process was then per manufacturer's instruction with the above findings  The hystersocope was removed and excellent hemostasis was noted, and all instruments were removed, with excellent hemostasis noted throughout.  She was then taken out of dorsal lithotomy. The patient tolerated the procedure well.  Sponge, lap and instrument counts were correct x2.  The patient was taken to recovery room in excellent condition.  Durene Romans MD Attending Center for [facility] farm [facility])

## 2020-02-07 NOTE — Brief Op Note (Signed)
02/07/2020  2:20 PM  PATIENT:  Diana Butler  39 y.o. female  PRE-OPERATIVE DIAGNOSIS:  AUB MENORRHAGIA DYSMENORRHEA  POST-OPERATIVE DIAGNOSIS:  AUB, MENORRHAGIA, DYSMENORRHEA. Polyps and fibroids  PROCEDURE:  Hysteroscopy, myosure polypectomy and myomectomy, hydrothermal ablation  SURGEON:  Surgeon(s) and Role:    * Aletha Halim, MD - Primary  PHYSICIAN ASSISTANT: None  ASSISTANTS: none   ANESTHESIA:   General and paracervical block  EBL:  10 mL   BLOOD ADMINISTERED:none  DRAINS: I/O cath per OR chart   LOCAL MEDICATIONS USED:  LIDOCAINE   SPECIMEN:  No Specimen  DISPOSITION OF SPECIMEN:  N/A  COUNTS:  YES  TOURNIQUET:  * No tourniquets in log *  DICTATION: .Note written in EPIC  PLAN OF CARE: Discharge to home after PACU  PATIENT DISPOSITION:  PACU - hemodynamically stable.   Delay start of Pharmacological VTE agent (>24hrs) due to surgical blood loss or risk of bleeding: not applicable  Durene Romans MD Attending [facility] for [place] [facility]) 02/07/2020 Time: [id number]

## 2020-02-07 NOTE — H&P (Signed)
Obstetrics & Gynecology Surgical  H&P   Date of Surgery: 02/07/2020   Primary OBGYN: [facility] Reason for Admission: scheduled surgery  History of Present Illness: Diana Butler is a 39 y.o. G2P0020 (Patient's last menstrual period was 01/30/2020.), with the above CC. PMHx is significant for fibroids, HTN and BTL.    Patient with history of AUB, dysmenorrhea and menorrhagia. She has tried depo provera to no effect and would like to proceed with removal of any polyps, fibroids and endometrial ablation. Still having some bleeding today  ROS: A 12-point review of systems was performed and negative, except as stated in the above HPI.  OBGYN History: As per HPI. OB History  Gravida Para Term Preterm AB Living  2       2 0  SAB TAB Ectopic Multiple Live Births      2        # Outcome Date GA Lbr Len/2nd Weight Sex Delivery Anes PTL Lv  2 Ectopic           1 Ectopic             Periods: qmonth, regular History of pap smears: Yes. Last pap smear 2021 NILM/[birth year]18[birth year]45 negative EMBx: negative 2021 with polyp seen   Past Medical History: Past Medical History:  Diagnosis Date  . Anemia   . Endometriosis    per patient report.   . Hypertension     Past Surgical History: Past Surgical History:  Procedure Laterality Date  . ENDOMETRIAL BIOPSY  01/10/2020      . SALPINGECTOMY     right, ectopic. pt states had l/s and ex-lap  . SALPINGECTOMY     left, ectopic. pt states had l/s and ex-lap  . TONSILLECTOMY     age 72    Family History:  History reviewed. No pertinent family history.  Social History:  Social History   Socioeconomic History  . Marital status: Single    Spouse name: Not on file  . Number of children: Not on file  . Years of education: Not on file  . Highest education level: Not on file  Occupational History  . Not on file  Tobacco Use  . Smoking status: Former Smoker    Packs/day: 0.50    Years: 12.00    Pack years: 6.00    Types:  Cigarettes    Quit date: 01/30/2019    Years since quitting: 1.0  . Smokeless tobacco: Never Used  Substance and Sexual Activity  . Alcohol use: Yes    Comment: occ  . Drug use: No  . Sexual activity: Yes    Birth control/protection: Surgical  Other Topics Concern  . Not on file  Social History Narrative  . Not on file   Social Determinants of Health   Financial Resource Strain:   . Difficulty of Paying Living Expenses: Not on file  Food Insecurity:   . Worried About Charity fundraiser in the Last Year: Not on file  . Ran Out of Food in the Last Year: Not on file  Transportation Needs:   . Lack of Transportation (Medical): Not on file  . Lack of Transportation (Non-Medical): Not on file  Physical Activity:   . Days of Exercise per Week: Not on file  . Minutes of Exercise per Session: Not on file  Stress:   . Feeling of Stress : Not on file  Social Connections:   . Frequency of Communication with Friends and Family: Not on file  .  Frequency of Social Gatherings with Friends and Family: Not on file  . Attends Religious Services: Not on file  . Active Member of Clubs or Organizations: Not on file  . Attends Archivist Meetings: Not on file  . Marital Status: Not on file  Intimate Partner Violence:   . Fear of Current or Ex-Partner: Not on file  . Emotionally Abused: Not on file  . Physically Abused: Not on file  . Sexually Abused: Not on file    Allergy: No Known Allergies  Current Outpatient Medications: Medications Prior to Admission  Medication Sig Dispense Refill Last Dose  . medroxyPROGESTERone (PROVERA) 10 MG tablet One tab po q8h x 48-72 hours if your periods are very heavy and/or painful. 45 tablet 1 Past Month at Unknown time  . naproxen (NAPROSYN) 500 MG tablet Take 1 tablet (500 mg total) by mouth 2 (two) times daily. 30 tablet 0 Past Month at Unknown time  . ferrous sulfate 325 (65 FE) MG tablet Take 1 tablet (325 mg total) by mouth 2 (two) times  daily with a meal. TAKE WITH ORANGE JUICE (Patient not taking: Reported on 10/18/2019) 60 tablet 2   . FLUoxetine (PROZAC) 10 MG capsule One tab po qday x1wk. Then two tabs po qday thereafter. (Patient not taking: Reported on 10/18/2019) 60 capsule 1   . naproxen (NAPROSYN) 500 MG tablet Take 1 tablet (500 mg total) by mouth 2 (two) times daily with a meal. (Patient not taking: Reported on 12/26/2019) 60 tablet 2   . ondansetron (ZOFRAN ODT) 4 MG disintegrating tablet 4mg  ODT q4 hours prn nausea/vomit (Patient not taking: Reported on 12/26/2019) 10 tablet 0   . oxyCODONE-acetaminophen (PERCOCET/ROXICET) 5-325 MG tablet Take 1-2 tablets by mouth every 6 (six) hours as needed. (Patient not taking: Reported on 10/18/2019) 20 tablet 0      Hospital Medications: Current Facility-Administered Medications  Medication Dose Route Frequency Provider Last Rate Last Admin  . lactated ringers infusion   Intravenous Continuous Lynda Rainwater, MD      . lactated ringers infusion   Intravenous Continuous Aletha Halim, MD         Physical Exam: Vitals:   01/31/20 1347  Weight: 66.7 kg  Height: 5\' 1"  (1.549 m)      No intake/output data recorded. No intake/output data recorded. No intake or output data in the 24 hours ending 02/07/20 1214   Current Vital Signs 24h Vital Sign Ranges  T   No data recorded  BP   No data recorded  HR   No data recorded  RR   No data recorded  SaO2     No data recorded       24 Hour I/O Current Shift I/O  Time Ins Outs No intake/output data recorded. No intake/output data recorded.   No data found.  Body mass index is 27.78 kg/m. General appearance: Well nourished, well developed female in no acute distress.  Cardiovascular: S1, S2 normal, no murmur, rub or gallop, regular rate and rhythm Respiratory:  Clear to auscultation bilateral. Normal respiratory effort Abdomen: positive bowel sounds and no masses, hernias; diffusely non tender to palpation, non  distended Neuro/Psych:  Normal mood and affect.  Skin:  Warm and dry.  Extremities: no clubbing, cyanosis, or edema.  Pelvic exam:  deferred   Laboratory: Negative-covid, upt Imaging:  CLINICAL DATA:  Pelvic pain  EXAM: TRANSABDOMINAL AND TRANSVAGINAL ULTRASOUND OF PELVIS  DOPPLER ULTRASOUND OF OVARIES  TECHNIQUE: Both transabdominal and transvaginal ultrasound  examinations of the pelvis were performed. Transabdominal technique was performed for global imaging of the pelvis including uterus, ovaries, adnexal regions, and pelvic cul-de-sac.  It was necessary to proceed with endovaginal exam following the transabdominal exam to visualize the uterus endometrium ovaries. Color and duplex Doppler ultrasound was utilized to evaluate blood flow to the ovaries.  COMPARISON:  Pelvic ultrasound 10/18/2019, 01/09/2018, 06/18/2017  FINDINGS: Uterus  Measurements: 8.5 x 4.7 x 6.1 cm = volume: 128.9 mL. Heterogeneous echotexture throughout. Multiple fibroids. Exophytic anterior fundal fibroid measures 9 x 9 x 11 mm. Sub mucosal right anterior fundal fibroid measures 1 x 0.9 x 0.8 cm. Heterogenous submucosal fibroid measures 2.4 x 1.6 x 2.6 cm.  Endometrium  Thickness: 11 mm.  No focal abnormality visualized.  Right ovary  Measurements: 3 x 1.8 x 2 cm = volume: 5.7 mL. Normal appearance/no adnexal mass.  Left ovary  Measurements: 3.3 x 2.1 x 2.7 cm = volume: 9.7 mL. Normal appearance/no adnexal mass.  Pulsed Doppler evaluation of both ovaries demonstrates normal low-resistance arterial and venous waveforms.  Other findings  Small free fluid in the right adnexa.  IMPRESSION: 1. Negative for ovarian torsion. 2. Heterogenous uterus with multiple fibroids. 3. Small amount of right adnexal fluid.   Electronically Signed   By: Donavan Foil M.D.   On: 12/26/2019 17:34  Assessment: Ms. Mancill is a 39 y.o. G2P0020 (Patient's last menstrual period was  01/30/2020.) with continued aub and pain. Pt stable Plan: D/w her plan is for hysteroscopy, removal of any polyps and/or fibroids and then HTA ablation. I also d/w her that this would preclude her from having children in the future. Pt is amenable to plan.   Durene Romans MD Attending Center for [facility])

## 2020-02-07 NOTE — Anesthesia Preprocedure Evaluation (Signed)
Anesthesia Evaluation  Patient identified by MRN, date of birth, ID band Patient awake    Reviewed: Allergy & Precautions, NPO status , Patient's Chart, lab work & pertinent test results  Airway Mallampati: II  TM Distance: >3 FB Neck ROM: Full    Dental no notable dental hx.    Pulmonary neg pulmonary ROS, former smoker,    Pulmonary exam normal breath sounds clear to auscultation       Cardiovascular hypertension, Normal cardiovascular exam Rhythm:Regular Rate:Normal     Neuro/Psych negative neurological ROS  negative psych ROS   GI/Hepatic negative GI ROS, Neg liver ROS,   Endo/Other  negative endocrine ROS  Renal/GU negative Renal ROS  negative genitourinary   Musculoskeletal negative musculoskeletal ROS (+)   Abdominal   Peds negative pediatric ROS (+)  Hematology negative hematology ROS (+)   Anesthesia Other Findings   Reproductive/Obstetrics negative OB ROS                             Anesthesia Physical Anesthesia Plan  ASA: II  Anesthesia Plan: General   Post-op Pain Management:    Induction: Intravenous  PONV Risk Score and Plan: 3 and Ondansetron, Dexamethasone, Midazolam and Treatment may vary due to age or medical condition  Airway Management Planned: LMA  Additional Equipment:   Intra-op Plan:   Post-operative Plan: Extubation in OR  Informed Consent: I have reviewed the patients History and Physical, chart, labs and discussed the procedure including the risks, benefits and alternatives for the proposed anesthesia with the patient or authorized representative who has indicated his/her understanding and acceptance.     Dental advisory given  Plan Discussed with: CRNA and Surgeon  Anesthesia Plan Comments:         Anesthesia Quick Evaluation

## 2020-02-07 NOTE — Anesthesia Postprocedure Evaluation (Signed)
Anesthesia Post Note  Patient: Product manager  Procedure(s) Performed: DILATATION & CURETTAGE/HYSTEROSCOPY WITH HYDROTHERMAL ABLATION AND POSSIBLE MYOSURE MYOMECTOMY (N/A Vagina )     Patient location during evaluation: PACU Anesthesia Type: General Level of consciousness: awake and alert Pain management: pain level controlled Vital Signs Assessment: post-procedure vital signs reviewed and stable Respiratory status: spontaneous breathing, nonlabored ventilation, respiratory function stable and patient connected to nasal cannula oxygen Cardiovascular status: blood pressure returned to baseline and stable Postop Assessment: no apparent nausea or vomiting Anesthetic complications: no    Last Vitals:  Vitals:   02/07/20 1415 02/07/20 1430  BP: (!) 173/103 (!) 180/105  Pulse: 68 (!) 50  Resp: 15 15  Temp: 36.7 C   SpO2: 100% 100%    Last Pain:  Vitals:   02/07/20 1445  PainSc: 3                  Javin Nong S

## 2020-02-07 NOTE — Anesthesia Procedure Notes (Signed)
Procedure Name: LMA Insertion Date/Time: 02/07/2020 1:08 PM Performed by: Raenette Rover, CRNA Pre-anesthesia Checklist: Patient identified, Emergency Drugs available, Suction available and Patient being monitored Patient Re-evaluated:Patient Re-evaluated prior to induction Oxygen Delivery Method: Circle system utilized Preoxygenation: Pre-oxygenation with 100% oxygen Induction Type: IV induction LMA: LMA inserted LMA Size: 4.0 Number of attempts: 1 Placement Confirmation: positive ETCO2 and breath sounds checked- equal and bilateral Tube secured with: Tape Dental Injury: Teeth and Oropharynx as per pre-operative assessment

## 2020-02-07 NOTE — Transfer of Care (Signed)
Immediate Anesthesia Transfer of Care Note  Patient: Diana Butler  Procedure(s) Performed: DILATATION & CURETTAGE/HYSTEROSCOPY WITH HYDROTHERMAL ABLATION AND POSSIBLE MYOSURE MYOMECTOMY (N/A Vagina )  Patient Location: PACU  Anesthesia Type:General  Level of Consciousness: awake, alert , oriented and patient cooperative  Airway & Oxygen Therapy: Patient Spontanous Breathing and Patient connected to nasal cannula oxygen  Post-op Assessment: Report given to RN and Post -op Vital signs reviewed and stable  Post vital signs: Reviewed and stable  Last Vitals:  Vitals Value Taken Time  BP 163/99 02/07/20 1414  Temp    Pulse 64 02/07/20 1416  Resp 10 02/07/20 1416  SpO2 100 % 02/07/20 1416  Vitals shown include unvalidated device data.  Last Pain:  Vitals:   02/07/20 1226  PainSc: 0-No pain         Complications: No apparent anesthesia complications

## 2020-02-08 LAB — SURGICAL PATHOLOGY

## 2020-02-15 ENCOUNTER — Encounter: Payer: Self-pay | Admitting: General Practice

## 2020-02-22 ENCOUNTER — Encounter: Payer: Self-pay | Admitting: *Deleted

## 2020-03-06 ENCOUNTER — Other Ambulatory Visit: Payer: Self-pay

## 2020-03-06 ENCOUNTER — Encounter: Payer: Self-pay | Admitting: Obstetrics and Gynecology

## 2020-03-06 ENCOUNTER — Ambulatory Visit (INDEPENDENT_AMBULATORY_CARE_PROVIDER_SITE_OTHER): Payer: 59 | Admitting: Obstetrics and Gynecology

## 2020-03-06 VITALS — BP 137/94 | HR 78 | Ht 61.0 in | Wt 152.3 lb

## 2020-03-06 DIAGNOSIS — N920 Excessive and frequent menstruation with regular cycle: Secondary | ICD-10-CM | POA: Diagnosis not present

## 2020-03-06 DIAGNOSIS — B373 Candidiasis of vulva and vagina: Secondary | ICD-10-CM

## 2020-03-06 DIAGNOSIS — B3731 Acute candidiasis of vulva and vagina: Secondary | ICD-10-CM

## 2020-03-06 DIAGNOSIS — D259 Leiomyoma of uterus, unspecified: Secondary | ICD-10-CM

## 2020-03-06 DIAGNOSIS — N898 Other specified noninflammatory disorders of vagina: Secondary | ICD-10-CM

## 2020-03-06 MED ORDER — METRONIDAZOLE 500 MG PO TABS: 500.0000 mg | ORAL_TABLET | Freq: Two times a day (BID) | ORAL | 0 refills | Status: DC

## 2020-03-06 MED ORDER — FLUCONAZOLE 150 MG PO TABS: 150.0000 mg | ORAL_TABLET | Freq: Once | ORAL | 3 refills | Status: AC

## 2020-03-06 NOTE — Progress Notes (Addendum)
[facility] 03/06/2020  CC: regular post op check  Subjective:     Diana Butler is s/p 3/3 Hysteroscopy, Myosure polypectomy and myomectomy, Hydrothermal ablation for heavy, painful periods and fibroids and polyps. She was discharged from the pacu.   Last depo provera was early February 2021  She is doing well and with no pain, bleeding. She is having some itching and some discharge  Objective:    BP (!) 137/94 (BP Location: Left Arm)   Pulse 78   Ht 5\' 1"  (1.549 m)   Wt 152 lb 4.8 oz (69.1 kg)   BMI 28.78 kg/m   NAD Abdomen: soft, nttp, nd Pelvic: EGBUS normal, vaginal vault with white cottage cheese like d/c, cervix and uterus normal nttp Assessment:    Doing well postoperatively.    Plan:   Pathology d/w pt and expectations for her periods now s/p ablation.   Durene Romans MD Attending Center for [employer] (Faculty Practice) 03/06/2020 Time: 0900

## 2020-07-19 ENCOUNTER — Telehealth: Payer: Self-pay

## 2020-07-19 NOTE — Telephone Encounter (Signed)
Patient was contacted regarding Mammography scholarship fund application. Patient states she is now empoyed and has insurance coverage. Patient informed will have to use current insurance coverage for mammogram, scholarship is for those with no insurance. Patient verbalized understanding.

## 2020-12-04 ENCOUNTER — Encounter (HOSPITAL_BASED_OUTPATIENT_CLINIC_OR_DEPARTMENT_OTHER): Payer: Self-pay | Admitting: *Deleted

## 2020-12-04 ENCOUNTER — Other Ambulatory Visit: Payer: Self-pay

## 2020-12-04 ENCOUNTER — Emergency Department (HOSPITAL_COMMUNITY)
Admission: EM | Admit: 2020-12-04 | Discharge: 2020-12-04 | Disposition: A | Discharge status: Home / Self Care | Payer: 59 | Attending: Emergency Medicine | Admitting: Emergency Medicine

## 2020-12-04 DIAGNOSIS — Z87891 Personal history of nicotine dependence: Secondary | ICD-10-CM | POA: Insufficient documentation

## 2020-12-04 DIAGNOSIS — I10 Essential (primary) hypertension: Secondary | ICD-10-CM | POA: Insufficient documentation

## 2020-12-04 DIAGNOSIS — R110 Nausea: Secondary | ICD-10-CM | POA: Insufficient documentation

## 2020-12-04 DIAGNOSIS — R1031 Right lower quadrant pain: Secondary | ICD-10-CM | POA: Insufficient documentation

## 2020-12-04 DIAGNOSIS — R197 Diarrhea, unspecified: Secondary | ICD-10-CM | POA: Insufficient documentation

## 2020-12-04 DIAGNOSIS — Z5321 Procedure and treatment not carried out due to patient leaving prior to being seen by health care provider: Secondary | ICD-10-CM | POA: Insufficient documentation

## 2020-12-04 LAB — COMPREHENSIVE METABOLIC PANEL
ALT: 15 U/L (ref 0–44)
AST: 18 U/L (ref 15–41)
Albumin: 4.2 g/dL (ref 3.5–5.0)
Alkaline Phosphatase: 51 U/L (ref 38–126)
Anion gap: 9 (ref 5–15)
BUN: 15 mg/dL (ref 6–20)
CO2: 23 mmol/L (ref 22–32)
Calcium: 8.9 mg/dL (ref 8.9–10.3)
Chloride: 106 mmol/L (ref 98–111)
Creatinine, Ser: 0.92 mg/dL (ref 0.44–1.00)
GFR, Estimated: >60 mL/min (ref >60)
Glucose, Bld: 93 mg/dL (ref 70–99)
Potassium: 4 mmol/L (ref 3.5–5.1)
Sodium: 138 mmol/L (ref 135–145)
Total Bilirubin: 0.7 mg/dL (ref 0.3–1.2)
Total Protein: 7.7 g/dL (ref 6.5–8.1)

## 2020-12-04 LAB — I-STAT BETA HCG BLOOD, ED (MC, WL, AP ONLY): I-stat hCG, quantitative: <5 m[IU]/mL (ref <5)

## 2020-12-04 LAB — CBC
HCT: 44.6 % (ref 36.0–46.0)
Hemoglobin: 14.3 g/dL (ref 12.0–15.0)
MCH: 27.2 pg (ref 26.0–34.0)
MCHC: 32.1 g/dL (ref 30.0–36.0)
MCV: 85 fL (ref 80.0–100.0)
Platelets: 229 10*3/uL (ref 150–400)
RBC: 5.25 MIL/uL — ABNORMAL HIGH (ref 3.87–5.11)
RDW: 14.1 % (ref 11.5–15.5)
WBC: 8.6 10*3/uL (ref 4.0–10.5)
nRBC: 0 % (ref 0.0–0.2)

## 2020-12-04 LAB — LIPASE, BLOOD: Lipase: 30 U/L (ref 11–51)

## 2020-12-04 NOTE — ED Triage Notes (Signed)
C/o right lower abd pain x 16 hrs , seen at [facility] today LWBS, labs drawn

## 2020-12-04 NOTE — ED Triage Notes (Signed)
Pt POV c/o RLQ abdominal pain starting this AM. Denies n/v.  Reports diarrhea starting today.

## 2020-12-04 NOTE — ED Notes (Signed)
Pt handed in labels and left 

## 2020-12-05 ENCOUNTER — Encounter (HOSPITAL_BASED_OUTPATIENT_CLINIC_OR_DEPARTMENT_OTHER): Payer: Self-pay | Admitting: Radiology

## 2020-12-05 ENCOUNTER — Emergency Department (HOSPITAL_BASED_OUTPATIENT_CLINIC_OR_DEPARTMENT_OTHER): Payer: 59

## 2020-12-05 ENCOUNTER — Emergency Department (HOSPITAL_BASED_OUTPATIENT_CLINIC_OR_DEPARTMENT_OTHER)
Admission: EM | Admit: 2020-12-05 | Discharge: 2020-12-05 | Disposition: A | Discharge status: Home / Self Care | Payer: 59 | Source: Home / Self Care | Attending: Emergency Medicine | Admitting: Emergency Medicine

## 2020-12-05 DIAGNOSIS — R1031 Right lower quadrant pain: Secondary | ICD-10-CM

## 2020-12-05 LAB — CBC WITH DIFFERENTIAL/PLATELET
Abs Immature Granulocytes: 0.01 10*3/uL (ref 0.00–0.07)
Basophils Absolute: 0.1 10*3/uL (ref 0.0–0.1)
Basophils Relative: 1 %
Eosinophils Absolute: 0 10*3/uL (ref 0.0–0.5)
Eosinophils Relative: 0 %
HCT: 44.2 % (ref 36.0–46.0)
Hemoglobin: 14.5 g/dL (ref 12.0–15.0)
Immature Granulocytes: 0 %
Lymphocytes Relative: 24 %
Lymphs Abs: 2.2 10*3/uL (ref 0.7–4.0)
MCH: 27.5 pg (ref 26.0–34.0)
MCHC: 32.8 g/dL (ref 30.0–36.0)
MCV: 83.7 fL (ref 80.0–100.0)
Monocytes Absolute: 0.6 10*3/uL (ref 0.1–1.0)
Monocytes Relative: 7 %
Neutro Abs: 6.3 10*3/uL (ref 1.7–7.7)
Neutrophils Relative %: 68 %
Platelets: 240 10*3/uL (ref 150–400)
RBC: 5.28 MIL/uL — ABNORMAL HIGH (ref 3.87–5.11)
RDW: 13.9 % (ref 11.5–15.5)
WBC: 9.3 10*3/uL (ref 4.0–10.5)
nRBC: 0 % (ref 0.0–0.2)

## 2020-12-05 MED ORDER — ONDANSETRON 4 MG PO TBDP: ORAL_TABLET | ORAL | 0 refills | Status: DC

## 2020-12-05 MED ORDER — SODIUM CHLORIDE 0.9 % IV BOLUS
1000.0000 mL | Freq: Once | INTRAVENOUS | Status: AC
  Administered 2020-12-05: 04:00:00 1000 mL via INTRAVENOUS

## 2020-12-05 MED ORDER — ONDANSETRON HCL 4 MG/2ML IJ SOLN
4.0000 mg | Freq: Once | INTRAMUSCULAR | Status: AC
  Administered 2020-12-05: 04:00:00 4 mg via INTRAVENOUS
  Filled 2020-12-05: qty 2

## 2020-12-05 MED ORDER — OXYCODONE-ACETAMINOPHEN 5-325 MG PO TABS: 1.0000 | ORAL_TABLET | Freq: Four times a day (QID) | ORAL | 0 refills | Status: DC | PRN

## 2020-12-05 MED ORDER — MORPHINE SULFATE (PF) 4 MG/ML IV SOLN
4.0000 mg | Freq: Once | INTRAVENOUS | Status: AC
  Administered 2020-12-05: 04:00:00 4 mg via INTRAVENOUS
  Filled 2020-12-05: qty 1

## 2020-12-05 MED ORDER — IOHEXOL 300 MG/ML  SOLN
100.0000 mL | Freq: Once | INTRAMUSCULAR | Status: AC | PRN
  Administered 2020-12-05: 05:00:00 100 mL via INTRAVENOUS

## 2020-12-05 NOTE — Discharge Instructions (Addendum)
Here blood work and CT scan did not show an obvious reason for your pain.  However, the tests can sometimes miss something significant.  If pain is getting worse, or is still as severe in 24 hours, return for reevaluation.

## 2020-12-05 NOTE — ED Provider Notes (Signed)
[place] EMERGENCY DEPARTMENT Provider Note   CSN: [id number] Arrival date & time: 12/04/20  2336   History Chief Complaint  Patient presents with  . Abdominal Pain    Diana Butler is a 39 y.o. female.  The history is provided by the patient.  Abdominal Pain She has history of hypertension, endometriosis and comes in because of right lower quadrant pain which started yesterday morning.  Pain is constant and she rates it at 6/10.  Is worse with movement, better if she stays still.  There has been associated nausea but no vomiting.  She did have one episode of diarrhea.  Pain was not affected by having a bowel movement.  She denies fever, chills, sweats.  She denies any urinary urgency, frequency, tenesmus, dysuria.  Last menses was before pelvic surgery 9 months ago.  She states that she does have an appetite, but she is afraid to eat anything because of her pain.  Old records are reviewed, she does have a prior ED visit for right lower quadrant pain  Past Medical History:  Diagnosis Date  . Anemia   . Endometriosis    per patient report.   . Hypertension     Patient Active Problem List   Diagnosis Date Noted  . 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 Submucous uterine fibroid 10/24/2019  . Abnormal uterine bleeding (AUB) 10/24/2019  . Anemia 02/21/2018  . Fibroid 07/22/2017  . Dysmenorrhea 07/22/2017  . Tobacco abuse 07/22/2017  . Depression 07/22/2017  . Menorrhagia with regular cycle 07/22/2017    Past Surgical History:  Procedure Laterality Date  . DILITATION & CURRETTAGE/HYSTROSCOPY WITH HYDROTHERMAL ABLATION N/A 02/07/2020   Procedure: DILATATION & CURETTAGE/HYSTEROSCOPY WITH HYDROTHERMAL ABLATION AND  MYOSURE POLYPECTOMY;  Surgeon: Aletha Halim, MD;  Location: [place];  Service: Gynecology;  Laterality: N/A;  . ENDOMETRIAL BIOPSY  01/10/2020      . SALPINGECTOMY     right, ectopic. pt states had l/s and ex-lap  . SALPINGECTOMY     left, ectopic. pt  states had l/s and ex-lap  . TONSILLECTOMY     age 89     OB History    Gravida  2   Para      Term      Preterm      AB  2   Living  0     SAB      IAB      Ectopic  2   Multiple      Live Births              No family history on file.  Social History   Tobacco Use  . Smoking status: Former Smoker    Packs/day: 0.50    Years: 12.00    Pack years: 6.00    Types: Cigarettes    Quit date: 01/30/2019    Years since quitting: 1.8  . Smokeless tobacco: Never Used  Vaping Use  . Vaping Use: Never used  Substance Use Topics  . Alcohol use: Yes    Comment: occ  . Drug use: Yes    Types: Marijuana    Comment: last time was Monday    Home Medications Prior to Admission medications   Medication Sig Start Date End Date Taking? Authorizing Provider  ferrous sulfate 325 (65 FE) MG tablet Take 1 tablet (325 mg total) by mouth 2 (two) times daily with a meal. TAKE WITH ORANGE JUICE Patient not taking: Reported on 10/18/2019 01/08/18   Street,  Mercedes, PA-C  FLUoxetine (PROZAC) 10 MG capsule One tab po qday x1wk. Then two tabs po qday thereafter. Patient not taking: Reported on 10/18/2019 07/22/17   Carrabelle Bing, MD  metroNIDAZOLE (FLAGYL) 500 MG tablet Take 1 tablet (500 mg total) by mouth 2 (two) times daily. 03/06/20   Glenwood Bing, MD  naproxen (NAPROSYN) 500 MG tablet Take 1 tablet (500 mg total) by mouth 2 (two) times daily with a meal. 09/12/19   Willodean Rosenthal, MD  ondansetron (ZOFRAN ODT) 4 MG disintegrating tablet 4mg  ODT q4 hours prn nausea/vomit Patient not taking: Reported on 12/26/2019 10/18/19   13/11/20, PA-C  oxyCODONE-acetaminophen (PERCOCET/ROXICET) 5-325 MG tablet Take 1 tablet by mouth every 6 (six) hours as needed. Patient not taking: Reported on 03/06/2020 02/07/20   04/08/20, MD    Allergies    Bee venom  Review of Systems   Review of Systems  Gastrointestinal: Positive for abdominal pain.  All other systems  reviewed and are negative.   Physical Exam Updated Vital Signs BP (!) 164/84 (BP Location: Left Arm)   Pulse 72   Temp 98.5 F (36.9 C) (Oral)   Resp 18   Ht 5\' 1"  (1.549 m)   SpO2 100%   BMI 28.78 kg/m   Physical Exam Vitals and nursing note reviewed.   39 year old female, resting comfortably and in no acute distress. Vital signs are significant for elevated blood pressure. Oxygen saturation is 100%, which is normal. Head is normocephalic and atraumatic. PERRLA, EOMI. Oropharynx is clear. Neck is nontender and supple without adenopathy or JVD. Back is nontender and there is no CVA tenderness. Lungs are clear without rales, wheezes, or rhonchi. Chest is nontender. Heart has regular rate and rhythm without murmur. Abdomen is soft, flat, with moderate right lower quadrant tenderness.  There is definite rebound tenderness without voluntary guarding.  There is no tenderness in the suprapubic area.  Masses or hepatosplenomegaly and peristalsis is hypoactive. Extremities have no cyanosis or edema, full range of motion is present. Skin is warm and dry without rash. Neurologic: Mental status is normal, cranial nerves are intact, there are no motor or sensory deficits.  ED Results / Procedures / Treatments   Labs (all labs ordered are listed, but only abnormal results are displayed) Labs Reviewed  CBC WITH DIFFERENTIAL/PLATELET - Abnormal; Notable for the following components:      Result Value   RBC 5.28 (*)    All other components within normal limits   Radiology CT ABDOMEN PELVIS W CONTRAST  Result Date: 12/05/2020 CLINICAL DATA:  Right lower quadrant abdominal pain EXAM: CT ABDOMEN AND PELVIS WITH CONTRAST TECHNIQUE: Multidetector CT imaging of the abdomen and pelvis was performed using the standard protocol following bolus administration of intravenous contrast. CONTRAST:  46 OMNIPAQUE IOHEXOL 300 MG/ML  SOLN COMPARISON:  12/26/2019 FINDINGS: Lower chest: The visualized lung  bases are clear bilaterally. The visualized heart and pericardium are unremarkable. Hepatobiliary: No focal liver abnormality is seen. No gallstones, gallbladder wall thickening, or biliary dilatation. Pancreas: Unremarkable Spleen: Unremarkable Adrenals/Urinary Tract: Adrenal glands are unremarkable. Kidneys are normal, without renal calculi, focal lesion, or hydronephrosis. Bladder is unremarkable. Stomach/Bowel: Stomach is within normal limits. Appendix appears normal. No evidence of bowel wall thickening, distention, or inflammatory changes. No free intraperitoneal gas or fluid. Vascular/Lymphatic: Retroaortic left renal vein noted. The abdominal vasculature is otherwise unremarkable. No pathologic adenopathy within the abdomen and pelvis. Reproductive: Heterogeneously enhancing, lobulated appearance of the uterine fundus is in keeping  with multiple underlying uterine fibroids, better noted on prior sonogram of 12/26/2019. The pelvic organs are otherwise unremarkable. Other: Rectum unremarkable.  No abdominal wall hernia identified. Musculoskeletal: No acute bone abnormality. IMPRESSION: No acute intra-abdominal pathology identified. No definite radiographic explanation for the patient's reported symptoms. Fibroid uterus. Electronically Signed   By: Helyn Numbers MD   On: 12/05/2020 04:56    Procedures Procedures   Medications Ordered in ED Medications  sodium chloride 0.9 % bolus 1,000 mL (has no administration in time range)  ondansetron (ZOFRAN) injection 4 mg (has no administration in time range)  morphine 4 MG/ML injection 4 mg (has no administration in time range)    ED Course  I have reviewed the triage vital signs and the nursing notes.  Pertinent labs & imaging results that were available during my care of the patient were reviewed by me and considered in my medical decision making (see chart for details).  MDM Rules/Calculators/A&P Right lower quadrant pain worrisome for  appendicitis.  Consider urinary tract infection, urolithiasis, pancreatitis, cholecystitis, diverticulitis, ovarian cyst.  She had presented to [facility] emergency department and had labs which are essentially normal.  Will check CBC to see if white blood cell count has increased, sent for CT of abdomen and pelvis to evaluate for possible appendicitis.  CT scan shows no acute process.  Repeat WBC is essentially unchanged and with normal differential.  She is discharged with prescription for small number of oxycodone-acetaminophen tablets as well as ondansetron oral dissolving tablets.  Return precautions discussed including if pain gets worse or if pain is still present at as high a level after 24 hours.  Final Clinical Impression(s) / ED Diagnoses Final diagnoses:  RLQ abdominal pain    Rx / DC Orders ED Discharge Orders         Ordered    oxyCODONE-acetaminophen (PERCOCET/ROXICET) 5-325 MG tablet  Every 6 hours PRN        12/05/20 0551    ondansetron (ZOFRAN ODT) 4 MG disintegrating tablet        12/05/20 0551           Dione Booze, MD 12/05/20 [phone]

## 2021-05-16 ENCOUNTER — Other Ambulatory Visit: Payer: Self-pay

## 2021-05-16 ENCOUNTER — Ambulatory Visit (HOSPITAL_COMMUNITY): Admission: EM | Admit: 2021-05-16 | Discharge: 2021-05-16 | Discharge status: Against Medical Advice | Payer: 59

## 2021-08-02 ENCOUNTER — Encounter (HOSPITAL_COMMUNITY): Payer: Self-pay

## 2021-08-02 ENCOUNTER — Other Ambulatory Visit: Payer: Self-pay

## 2021-08-02 ENCOUNTER — Emergency Department (HOSPITAL_COMMUNITY)
Admission: EM | Admit: 2021-08-02 | Discharge: 2021-08-02 | Disposition: A | Discharge status: Home / Self Care | Payer: 59 | Attending: Emergency Medicine | Admitting: Emergency Medicine

## 2021-08-02 ENCOUNTER — Emergency Department (HOSPITAL_COMMUNITY): Payer: 59

## 2021-08-02 DIAGNOSIS — B9689 Other specified bacterial agents as the cause of diseases classified elsewhere: Secondary | ICD-10-CM

## 2021-08-02 DIAGNOSIS — Z87891 Personal history of nicotine dependence: Secondary | ICD-10-CM | POA: Diagnosis not present

## 2021-08-02 DIAGNOSIS — R1031 Right lower quadrant pain: Secondary | ICD-10-CM | POA: Diagnosis not present

## 2021-08-02 DIAGNOSIS — N9489 Other specified conditions associated with female genital organs and menstrual cycle: Secondary | ICD-10-CM | POA: Diagnosis not present

## 2021-08-02 DIAGNOSIS — D251 Intramural leiomyoma of uterus: Secondary | ICD-10-CM

## 2021-08-02 DIAGNOSIS — I10 Essential (primary) hypertension: Secondary | ICD-10-CM | POA: Insufficient documentation

## 2021-08-02 DIAGNOSIS — N760 Acute vaginitis: Secondary | ICD-10-CM

## 2021-08-02 LAB — CBC
HCT: 40.7 % (ref 36.0–46.0)
Hemoglobin: 13.4 g/dL (ref 12.0–15.0)
MCH: 27.6 pg (ref 26.0–34.0)
MCHC: 32.9 g/dL (ref 30.0–36.0)
MCV: 83.7 fL (ref 80.0–100.0)
Platelets: 246 10*3/uL (ref 150–400)
RBC: 4.86 MIL/uL (ref 3.87–5.11)
RDW: 13.2 % (ref 11.5–15.5)
WBC: 8.1 10*3/uL (ref 4.0–10.5)
nRBC: 0 % (ref 0.0–0.2)

## 2021-08-02 LAB — COMPREHENSIVE METABOLIC PANEL
ALT: 13 U/L (ref 0–44)
AST: 15 U/L (ref 15–41)
Albumin: 4.2 g/dL (ref 3.5–5.0)
Alkaline Phosphatase: 47 U/L (ref 38–126)
Anion gap: 8 (ref 5–15)
BUN: 14 mg/dL (ref 6–20)
CO2: 23 mmol/L (ref 22–32)
Calcium: 8.9 mg/dL (ref 8.9–10.3)
Chloride: 106 mmol/L (ref 98–111)
Creatinine, Ser: 0.9 mg/dL (ref 0.44–1.00)
GFR, Estimated: >60 mL/min (ref >60)
Glucose, Bld: 105 mg/dL — ABNORMAL HIGH (ref 70–99)
Potassium: 3.6 mmol/L (ref 3.5–5.1)
Sodium: 137 mmol/L (ref 135–145)
Total Bilirubin: 0.5 mg/dL (ref 0.3–1.2)
Total Protein: 7.1 g/dL (ref 6.5–8.1)

## 2021-08-02 LAB — I-STAT BETA HCG BLOOD, ED (MC, WL, AP ONLY): I-stat hCG, quantitative: <5 m[IU]/mL (ref <5)

## 2021-08-02 LAB — WET PREP, GENITAL
Sperm: NONE SEEN
Trich, Wet Prep: NONE SEEN
Yeast Wet Prep HPF POC: NONE SEEN

## 2021-08-02 LAB — URINALYSIS, ROUTINE W REFLEX MICROSCOPIC
Bilirubin Urine: NEGATIVE
Glucose, UA: NEGATIVE mg/dL
Hgb urine dipstick: NEGATIVE
Ketones, ur: NEGATIVE mg/dL
Leukocytes,Ua: NEGATIVE
Nitrite: NEGATIVE
Protein, ur: NEGATIVE mg/dL
Specific Gravity, Urine: 1.014 (ref 1.005–1.030)
pH: 6 (ref 5.0–8.0)

## 2021-08-02 LAB — LIPASE, BLOOD: Lipase: 43 U/L (ref 11–51)

## 2021-08-02 MED ORDER — METRONIDAZOLE 500 MG PO TABS: 500.0000 mg | ORAL_TABLET | Freq: Two times a day (BID) | ORAL | 0 refills | Status: DC

## 2021-08-02 MED ORDER — IBUPROFEN 200 MG PO TABS
600.0000 mg | ORAL_TABLET | Freq: Once | ORAL | Status: AC
  Administered 2021-08-02: 600 mg via ORAL
  Filled 2021-08-02: qty 3

## 2021-08-02 MED ORDER — ONDANSETRON HCL 4 MG/2ML IJ SOLN
4.0000 mg | Freq: Once | INTRAMUSCULAR | Status: AC
  Administered 2021-08-02: 4 mg via INTRAVENOUS
  Filled 2021-08-02: qty 2

## 2021-08-02 MED ORDER — FENTANYL CITRATE PF 50 MCG/ML IJ SOSY
50.0000 ug | PREFILLED_SYRINGE | Freq: Once | INTRAMUSCULAR | Status: AC
  Administered 2021-08-02: 50 ug via INTRAVENOUS
  Filled 2021-08-02: qty 1

## 2021-08-02 NOTE — Discharge Instructions (Addendum)
Your ultrasound showed uterine fibroids which could be contributing to your pain. Please take 600 mg Ibuprofen every 6-8 hours as needed for pain. Warm compresses to your abdomen can also help alleviate pain.   Pick up your antibiotic and take as prescribed to cover for bacterial vaginosis which was found on your pelvic exam today. Do not drink alcohol while on this medication.   Follow up with your OBGYN for further evaluation of your fibroids  Return to the ED for any new/worsening symptoms

## 2021-08-02 NOTE — ED Provider Notes (Signed)
[facility] DEPT Provider Note   CSN: [id number] Arrival date & time: 08/02/21  [id number]     History Chief Complaint  Patient presents with   Abdominal Pain    Diana Butler is a 40 y.o. female who presents emergency department chief complaint of right lower quadrant abdominal pain.  Patient has a past medical history of endometriosis is status post ablation about a year ago.  She does not have regular menstrual periods and does not know the last time she had 1.  Patient reports that over the past 3 to 4 months she has been having episodes of fairly intense right lower quadrant abdominal pain with today's episode being the worst.  She states that it occurs every 2 weeks or so.  She denies dyspareunia.  She did notice some vaginal discharge this morning.  She also acknowledges urinary frequency but denies flank pain, fevers, chills, nausea, vomiting, diarrhea or significant constipation.  Patient states that when she gets that she does try to take something and took a laxative without relief of symptoms.  She took Motrin which did improve her symptoms.  Pain is worsened with movement and was very bad with the car ride over whenever they hit a bump.  Has no other complaints at this time.   Abdominal Pain     Past Medical History:  Diagnosis Date   Anemia    Endometriosis    per patient report.    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   Submucous uterine fibroid 10/24/2019   Abnormal uterine bleeding (AUB) 10/24/2019   Anemia 02/21/2018   Fibroid 07/22/2017   Dysmenorrhea 07/22/2017   Tobacco abuse 07/22/2017   Depression 07/22/2017   Menorrhagia with regular cycle 07/22/2017    Past Surgical History:  Procedure Laterality Date   DILITATION & CURRETTAGE/HYSTROSCOPY WITH HYDROTHERMAL ABLATION N/A 02/07/2020   Procedure: DILATATION & CURETTAGE/HYSTEROSCOPY WITH HYDROTHERMAL ABLATION AND  MYOSURE  POLYPECTOMY;  Surgeon: Aletha Halim, MD;  Location: [facility];  Service: Gynecology;  Laterality: N/A;   ENDOMETRIAL BIOPSY  01/10/2020       SALPINGECTOMY     right, ectopic. pt states had l/s and ex-lap   SALPINGECTOMY     left, ectopic. pt states had l/s and ex-lap   TONSILLECTOMY     age 79     OB History     Gravida  2   Para      Term      Preterm      AB  2   Living  0      SAB      IAB      Ectopic  2   Multiple      Live Births              History reviewed. No pertinent family history.  Social History   Tobacco Use   Smoking status: Former    Packs/day: 0.50    Years: 12.00    Pack years: 6.00    Types: Cigarettes    Quit date: 01/30/2019    Years since quitting: 2.5   Smokeless tobacco: Never  Vaping Use   Vaping Use: Never used  Substance Use Topics   Alcohol use: Yes    Comment: occ   Drug use: Yes    Types: Marijuana    Comment: last time was Monday    Home Medications Prior to Admission  medications   Medication Sig Start Date End Date Taking? Authorizing Provider  FLUoxetine (PROZAC) 10 MG capsule One tab po qday x1wk. Then two tabs po qday thereafter. Patient not taking: Reported on 10/18/2019 07/22/17   Aletha Halim, MD  naproxen (NAPROSYN) 500 MG tablet Take 1 tablet (500 mg total) by mouth 2 (two) times daily with a meal. 09/12/19   Lavonia Drafts, MD  ondansetron (ZOFRAN ODT) 4 MG disintegrating tablet '4mg'$  ODT q4 hours prn nausea/vomit Q000111Q   Delora Fuel, MD  oxyCODONE-acetaminophen (PERCOCET/ROXICET) 5-325 MG tablet Take 1 tablet by mouth every 6 (six) hours as needed. [id number]   Delora Fuel, MD  ferrous sulfate 325 (65 FE) MG tablet Take 1 tablet (325 mg total) by mouth 2 (two) times daily with a meal. TAKE WITH ORANGE JUICE Patient not taking: Reported on 10/18/2019 01/08/18 12/05/20  Street, [place], PA-C    Allergies    Bee venom  Review of Systems   Review of Systems   Gastrointestinal:  Positive for abdominal pain.  Ten systems reviewed and are negative for acute change, except as noted in the HPI.   Physical Exam Updated Vital Signs BP (!) 163/90 (BP Location: Left Arm)   Pulse 68   Temp 98.2 F (36.8 C) (Oral)   Resp 16   SpO2 100%   Physical Exam Vitals and nursing note reviewed.  Constitutional:      General: She is not in acute distress.    Appearance: She is well-developed. She is not diaphoretic.  HENT:     Head: Normocephalic and atraumatic.     Right Ear: External ear normal.     Left Ear: External ear normal.     Nose: Nose normal.     Mouth/Throat:     Mouth: Mucous membranes are moist.  Eyes:     General: No scleral icterus.    Conjunctiva/sclera: Conjunctivae normal.  Cardiovascular:     Rate and Rhythm: Normal rate and regular rhythm.     Heart sounds: Normal heart sounds. No murmur heard.   No friction rub. No gallop.  Pulmonary:     Effort: Pulmonary effort is normal. No respiratory distress.     Breath sounds: Normal breath sounds.  Abdominal:     General: Bowel sounds are normal. There is no distension.     Palpations: Abdomen is soft. There is no mass.     Tenderness: There is no abdominal tenderness. There is no guarding.  Genitourinary:    Comments: Pelvic exam: normal external genitalia, vulva, vagina, cervix, uterus.  Tenderness to the right adnexa Musculoskeletal:     Cervical back: Normal range of motion.  Skin:    General: Skin is warm and dry.  Neurological:     Mental Status: She is alert and oriented to person, place, and time.  Psychiatric:        Behavior: Behavior normal.    ED Results / Procedures / Treatments   Labs (all labs ordered are listed, but only abnormal results are displayed) Labs Reviewed  CBC  URINALYSIS, ROUTINE W REFLEX MICROSCOPIC  LIPASE, BLOOD  COMPREHENSIVE METABOLIC PANEL  I-STAT BETA HCG BLOOD, ED ([facility], [facility], [facility] ONLY)    EKG None  Radiology No results  found.  Procedures Procedures   Medications Ordered in ED Medications - No data to display  ED Course  I have reviewed the triage vital signs and the nursing notes.  Pertinent labs & imaging results that were available during my care of the  patient were reviewed by me and considered in my medical decision making (see chart for details).    MDM Rules/Calculators/A&P                           40 year old female here with right lower quadrant abdominal pain.  Tenderness in the adnexa on examination.  I ordered and reviewed labs that included CBC, CMP, UA and pregnancy test all of which are negative and without significant abnormality.  Wet prep is positive for clue cells likely representative of BV.  Ultrasound is currently pending.  Give given signout to [place] who will assume care of the patient.  If negative follow-up with OB/GYN.  Treatment for BV.  Anti-inflammatory medications. Final Clinical Impression(s) / ED Diagnoses Final diagnoses:  None    Rx / DC Orders ED Discharge Orders     None        Margarita Mail, PA-C 08/02/21 [place], [place], DO 08/03/21 [phone]

## 2021-08-02 NOTE — ED Triage Notes (Signed)
Pt reports lower abdominal pain with some abnormal vaginal discharge. Denies N/V/D and vaginal bleeding.

## 2021-08-02 NOTE — ED Provider Notes (Signed)
Care assumed from [facility], [place], at shift change, please see their notes for full documentation of patient's complaint/HPI. Briefly, pt here with complaints of intermittent RLQ abdominal pain for the past 3-4 months. Results so far show wet prep positive for BV. Remainder of lab work unremarkable. Awaiting pelvic ultrasound. Plan is to dispo accordingly and treat for BV if ultrasound negative for acute findings.   Physical Exam  BP (!) 160/103   Pulse 64   Temp 98.2 F (36.8 C) (Oral)   Resp 20   SpO2 98%   Physical Exam Vitals and nursing note reviewed.  Constitutional:      Appearance: She is not ill-appearing.  HENT:     Head: Normocephalic and atraumatic.  Eyes:     Conjunctiva/sclera: Conjunctivae normal.  Cardiovascular:     Rate and Rhythm: Normal rate and regular rhythm.  Pulmonary:     Effort: Pulmonary effort is normal.     Breath sounds: Normal breath sounds.  Skin:    General: Skin is warm and dry.     Coloration: Skin is not jaundiced.  Neurological:     Mental Status: She is alert.    ED Course/Procedures     Procedures  Results for orders placed or performed during the hospital encounter of 08/02/21  Wet prep, genital   Specimen: PATH Cytology Cervicovaginal Ancillary Only  Result Value Ref Range   Yeast Wet Prep HPF POC NONE SEEN NONE SEEN   Trich, Wet Prep NONE SEEN NONE SEEN   Clue Cells Wet Prep HPF POC PRESENT (A) NONE SEEN   WBC, Wet Prep HPF POC MODERATE (A) NONE SEEN   Sperm NONE SEEN   Lipase, blood  Result Value Ref Range   Lipase 43 11 - 51 U/L  Comprehensive metabolic panel  Result Value Ref Range   Sodium 137 135 - 145 mmol/L   Potassium 3.6 3.5 - 5.1 mmol/L   Chloride 106 98 - 111 mmol/L   CO2 23 22 - 32 mmol/L   Glucose, Bld 105 (H) 70 - 99 mg/dL   BUN 14 6 - 20 mg/dL   Creatinine, Ser 0.90 0.44 - 1.00 mg/dL   Calcium 8.9 8.9 - 10.3 mg/dL   Total Protein 7.1 6.5 - 8.1 g/dL   Albumin 4.2 3.5 - 5.0 g/dL   AST 15 15 - 41 U/L    ALT 13 0 - 44 U/L   Alkaline Phosphatase 47 38 - 126 U/L   Total Bilirubin 0.5 0.3 - 1.2 mg/dL   GFR, Estimated >60 >60 mL/min   Anion gap 8 5 - 15  CBC  Result Value Ref Range   WBC 8.1 4.0 - 10.5 K/uL   RBC 4.86 3.87 - 5.11 MIL/uL   Hemoglobin 13.4 12.0 - 15.0 g/dL   HCT 40.7 36.0 - 46.0 %   MCV 83.7 80.0 - 100.0 fL   MCH 27.6 26.0 - 34.0 pg   MCHC 32.9 30.0 - 36.0 g/dL   RDW 13.2 11.5 - 15.5 %   Platelets 246 150 - 400 K/uL   nRBC 0.0 0.0 - 0.2 %  Urinalysis, Routine w reflex microscopic Urine, Clean Catch  Result Value Ref Range   Color, Urine YELLOW YELLOW   APPearance CLEAR CLEAR   Specific Gravity, Urine 1.014 1.005 - 1.030   pH 6.0 5.0 - 8.0   Glucose, UA NEGATIVE NEGATIVE mg/dL   Hgb urine dipstick NEGATIVE NEGATIVE   Bilirubin Urine NEGATIVE NEGATIVE   Ketones, ur NEGATIVE  NEGATIVE mg/dL   Protein, ur NEGATIVE NEGATIVE mg/dL   Nitrite NEGATIVE NEGATIVE   Leukocytes,Ua NEGATIVE NEGATIVE  I-Stat beta hCG blood, ED  Result Value Ref Range   I-stat hCG, quantitative <5.0 <5 mIU/mL   Comment 3           US PELVIC COMPLETE W TRANSVAGINAL AND TORSION R/O  Result Date: 08/02/2021 CLINICAL DATA:  Right lower quadrant pain since yesterday. Endometrial ablation on 02/07/2020. Not on tamoxifen or hormone therapy. Patient is premenopausal. EXAM: TRANSABDOMINAL AND TRANSVAGINAL ULTRASOUND OF PELVIS DOPPLER ULTRASOUND OF OVARIES TECHNIQUE: Both transabdominal and transvaginal ultrasound examinations of the pelvis were performed. Transabdominal technique was performed for global imaging of the pelvis including uterus, ovaries, adnexal regions, and pelvic cul-de-sac. It was necessary to proceed with endovaginal exam following the transabdominal exam to visualize the endometrium and bilateral ovaries. Color and duplex Doppler ultrasound was utilized to evaluate blood flow to the ovaries. COMPARISON:  CT abdomen pelvis 12/05/2020, ultrasound pelvis 12/26/2019 FINDINGS: Uterus  Measurements: 8.1 x 3.8 x 4.8 cm = volume: 77 mL. Total of 3 hypoechoic intramural uterine masses are noted likely representing fibroids: 1.1 x 0.9 x 1.1 cm, 0.9 x 1.2 x 1.1 cm, and 0.9 x 0.8 x 0.7 cm. These lesions have not increased in size. No other mass visualized. Endometrium Thickness: 8 mm.  No focal abnormality visualized. Right ovary Measurements: 3.4 x 1.9 x 2.7 cm = volume: 9.4 mL. Normal appearance/no adnexal mass. Left ovary Measurements: 2.8 x 1.8 x 2.3 cm = volume: 5.9 mL. Normal appearance/no adnexal mass. Pulsed Doppler evaluation of both ovaries demonstrates normal low-resistance arterial and venous waveforms. Other findings No abnormal free fluid. IMPRESSION: 1. Pericentimeter intramural uterine fibroids. 2. Otherwise unremarkable pelvic ultrasound. Electronically Signed   By: Iven Finn M.D.   On: 08/02/2021 15:43     MDM  Pelvic ultrasound positive for intramural fibroids; no other findings including concern for torsion. Will treat for BV at this time. Nursing staff informed that pt is requesting something else for pain - antiinflammatory provided. Stable for discharge home at this time with OBGYN follow up.   This note was prepared using Dragon voice recognition software and may include unintentional dictation errors due to the inherent limitations of voice recognition software.        Eustaquio Maize, PA-C 08/02/21 1552    Gareth Morgan, MD 08/04/21 2204

## 2021-08-04 LAB — GC/CHLAMYDIA PROBE AMP (~~LOC~~) NOT AT ARMC
Chlamydia: NEGATIVE
Comment: NEGATIVE
Comment: NORMAL
Neisseria Gonorrhea: NEGATIVE

## 2021-09-25 ENCOUNTER — Emergency Department (HOSPITAL_COMMUNITY): Payer: 59

## 2021-09-25 ENCOUNTER — Encounter (HOSPITAL_COMMUNITY): Payer: Self-pay

## 2021-09-25 ENCOUNTER — Other Ambulatory Visit: Payer: Self-pay

## 2021-09-25 ENCOUNTER — Emergency Department (HOSPITAL_COMMUNITY)
Admission: EM | Admit: 2021-09-25 | Discharge: 2021-09-25 | Disposition: A | Discharge status: Home / Self Care | Payer: 59 | Attending: Emergency Medicine | Admitting: Emergency Medicine

## 2021-09-25 DIAGNOSIS — R102 Pelvic and perineal pain: Secondary | ICD-10-CM | POA: Diagnosis not present

## 2021-09-25 DIAGNOSIS — D219 Benign neoplasm of connective and other soft tissue, unspecified: Secondary | ICD-10-CM

## 2021-09-25 DIAGNOSIS — I10 Essential (primary) hypertension: Secondary | ICD-10-CM | POA: Insufficient documentation

## 2021-09-25 DIAGNOSIS — Z87891 Personal history of nicotine dependence: Secondary | ICD-10-CM | POA: Diagnosis not present

## 2021-09-25 DIAGNOSIS — R112 Nausea with vomiting, unspecified: Secondary | ICD-10-CM | POA: Insufficient documentation

## 2021-09-25 LAB — COMPREHENSIVE METABOLIC PANEL
ALT: 10 U/L (ref 0–44)
AST: 14 U/L — ABNORMAL LOW (ref 15–41)
Albumin: 4.2 g/dL (ref 3.5–5.0)
Alkaline Phosphatase: 44 U/L (ref 38–126)
Anion gap: 11 (ref 5–15)
BUN: 10 mg/dL (ref 6–20)
CO2: 21 mmol/L — ABNORMAL LOW (ref 22–32)
Calcium: 8.9 mg/dL (ref 8.9–10.3)
Chloride: 108 mmol/L (ref 98–111)
Creatinine, Ser: 0.77 mg/dL (ref 0.44–1.00)
GFR, Estimated: >60 mL/min (ref >60)
Glucose, Bld: 110 mg/dL — ABNORMAL HIGH (ref 70–99)
Potassium: 3.5 mmol/L (ref 3.5–5.1)
Sodium: 140 mmol/L (ref 135–145)
Total Bilirubin: 0.4 mg/dL (ref 0.3–1.2)
Total Protein: 7.5 g/dL (ref 6.5–8.1)

## 2021-09-25 LAB — CBC WITH DIFFERENTIAL/PLATELET
Abs Immature Granulocytes: 0.02 10*3/uL (ref 0.00–0.07)
Basophils Absolute: 0.1 10*3/uL (ref 0.0–0.1)
Basophils Relative: 1 %
Eosinophils Absolute: 0.1 10*3/uL (ref 0.0–0.5)
Eosinophils Relative: 1 %
HCT: 42 % (ref 36.0–46.0)
Hemoglobin: 13.7 g/dL (ref 12.0–15.0)
Immature Granulocytes: 0 %
Lymphocytes Relative: 33 %
Lymphs Abs: 2.7 10*3/uL (ref 0.7–4.0)
MCH: 27.2 pg (ref 26.0–34.0)
MCHC: 32.6 g/dL (ref 30.0–36.0)
MCV: 83.5 fL (ref 80.0–100.0)
Monocytes Absolute: 0.5 10*3/uL (ref 0.1–1.0)
Monocytes Relative: 6 %
Neutro Abs: 4.8 10*3/uL (ref 1.7–7.7)
Neutrophils Relative %: 59 %
Platelets: 251 10*3/uL (ref 150–400)
RBC: 5.03 MIL/uL (ref 3.87–5.11)
RDW: 13.4 % (ref 11.5–15.5)
WBC: 8.1 10*3/uL (ref 4.0–10.5)
nRBC: 0 % (ref 0.0–0.2)

## 2021-09-25 LAB — I-STAT BETA HCG BLOOD, ED (MC, WL, AP ONLY): I-stat hCG, quantitative: <5 m[IU]/mL (ref <5)

## 2021-09-25 LAB — LIPASE, BLOOD: Lipase: 36 U/L (ref 11–51)

## 2021-09-25 MED ORDER — ONDANSETRON HCL 4 MG/2ML IJ SOLN
4.0000 mg | Freq: Once | INTRAMUSCULAR | Status: AC
  Administered 2021-09-25: 4 mg via INTRAVENOUS
  Filled 2021-09-25: qty 2

## 2021-09-25 MED ORDER — FENTANYL CITRATE PF 50 MCG/ML IJ SOSY
25.0000 ug | PREFILLED_SYRINGE | Freq: Once | INTRAMUSCULAR | Status: AC
  Administered 2021-09-25: 25 ug via INTRAVENOUS
  Filled 2021-09-25: qty 1

## 2021-09-25 MED ORDER — ONDANSETRON 4 MG PO TBDP: ORAL_TABLET | ORAL | 0 refills | Status: DC

## 2021-09-25 MED ORDER — HYDROCODONE-ACETAMINOPHEN 5-325 MG PO TABS: 1.0000 | ORAL_TABLET | Freq: Four times a day (QID) | ORAL | 0 refills | Status: DC | PRN

## 2021-09-25 MED ORDER — SODIUM CHLORIDE 0.9 % IV BOLUS
1000.0000 mL | Freq: Once | INTRAVENOUS | Status: AC
  Administered 2021-09-25: 1000 mL via INTRAVENOUS

## 2021-09-25 MED ORDER — KETOROLAC TROMETHAMINE 30 MG/ML IJ SOLN
30.0000 mg | Freq: Once | INTRAMUSCULAR | Status: AC
  Administered 2021-09-25: 30 mg via INTRAVENOUS
  Filled 2021-09-25: qty 1

## 2021-09-25 NOTE — Discharge Instructions (Addendum)
You have fibroids and that is likely causing your pain.  You can also have pain from endometriosis for  Take Tylenol or Motrin for pain.  Take Zofran for nausea.  Take Norco for severe pain.  See GYN for follow-up.  Return to ER if you have worse abdominal pain, vomiting, fever.

## 2021-09-25 NOTE — ED Provider Notes (Signed)
[facility] DEPT Provider Note   CSN: [id number] Arrival date & time: 09/25/21  4431     History Chief Complaint  Patient presents with   Abdominal Pain    Diana Butler is a 40 y.o. female hx of endometriosis, HTN, here presenting with nausea vomiting and pelvic pain.  Patient states that she woke up this morning and had cute onset of pelvic pain. She states that it is severe and 10 out of 10.  Denies any radiation to the pain.  Denies any pelvic discharge or vaginal bleeding.  She states that she has some nausea and vomiting as well.  Patient was seen previously for similar symptoms and was diagnosed with ovarian cysts.  She does not currently have any GYN doctor  The history is provided by the patient.      Past Medical History:  Diagnosis Date   Anemia    Endometriosis    per patient report.    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   Submucous uterine fibroid 10/24/2019   Abnormal uterine bleeding (AUB) 10/24/2019   Anemia 02/21/2018   Fibroid 07/22/2017   Dysmenorrhea 07/22/2017   Tobacco abuse 07/22/2017   Depression 07/22/2017   Menorrhagia with regular cycle 07/22/2017    Past Surgical History:  Procedure Laterality Date   DILITATION & CURRETTAGE/HYSTROSCOPY WITH HYDROTHERMAL ABLATION N/A 02/07/2020   Procedure: DILATATION & CURETTAGE/HYSTEROSCOPY WITH HYDROTHERMAL ABLATION AND  MYOSURE POLYPECTOMY;  Surgeon: Aletha Halim, MD;  Location: [facility];  Service: Gynecology;  Laterality: N/A;   ENDOMETRIAL BIOPSY  01/10/2020       SALPINGECTOMY     right, ectopic. pt states had l/s and ex-lap   SALPINGECTOMY     left, ectopic. pt states had l/s and ex-lap   TONSILLECTOMY     age 26     OB History     Gravida  2   Para      Term      Preterm      AB  2   Living  0      SAB      IAB      Ectopic  2   Multiple      Live Births               History reviewed. No pertinent family history.  Social History   Tobacco Use   Smoking status: Former    Packs/day: 0.50    Years: 12.00    Pack years: 6.00    Types: Cigarettes    Quit date: 01/30/2019    Years since quitting: 2.6   Smokeless tobacco: Never  Vaping Use   Vaping Use: Never used  Substance Use Topics   Alcohol use: Yes    Comment: occ   Drug use: Yes    Types: Marijuana    Comment: last time was Monday    Home Medications Prior to Admission medications   Medication Sig Start Date End Date Taking? Authorizing Provider  metroNIDAZOLE (FLAGYL) 500 MG tablet Take 1 tablet (500 mg total) by mouth 2 (two) times daily. Patient not taking: Reported on 09/25/2021 08/02/21   Eustaquio Maize, PA-C  ferrous sulfate 325 (65 FE) MG tablet Take 1 tablet (325 mg total) by mouth 2 (two) times daily with a meal. TAKE WITH ORANGE JUICE Patient not taking: Reported on 10/18/2019 01/08/18 12/05/20  Street, [place], [place]  Allergies    Bee venom  Review of Systems   Review of Systems  Gastrointestinal:  Positive for abdominal pain, nausea and vomiting.  All other systems reviewed and are negative.  Physical Exam Updated Vital Signs BP (!) 164/80   Pulse (!) 51   Temp 98.3 F (36.8 C) (Oral)   Resp 16   Ht 5\' 1"  (1.549 m)   Wt 70 kg   SpO2 99%   BMI 29.16 kg/m   Physical Exam Vitals and nursing note reviewed.  Constitutional:      Comments: Uncomfortable  Eyes:     Extraocular Movements: Extraocular movements intact.  Cardiovascular:     Rate and Rhythm: Normal rate and regular rhythm.  Abdominal:     General: Abdomen is flat.     Comments: Mild diffuse pelvic tenderness  Skin:    General: Skin is warm.     Capillary Refill: Capillary refill takes less than 2 seconds.  Neurological:     General: No focal deficit present.     Mental Status: She is alert and oriented to person, place, and time.  Psychiatric:        Mood and Affect: Mood normal.         Behavior: Behavior normal.    ED Results / Procedures / Treatments   Labs (all labs ordered are listed, but only abnormal results are displayed) Labs Reviewed  COMPREHENSIVE METABOLIC PANEL - Abnormal; Notable for the following components:      Result Value   CO2 21 (*)    Glucose, Bld 110 (*)    AST 14 (*)    All other components within normal limits  CBC WITH DIFFERENTIAL/PLATELET  LIPASE, BLOOD  URINALYSIS, ROUTINE W REFLEX MICROSCOPIC  I-STAT BETA HCG BLOOD, ED ([facility], [facility], [facility] ONLY)    EKG None  Radiology US Transvaginal Non-OB  Result Date: 09/25/2021 CLINICAL DATA:  Pelvic and abdominal pain, history of fibroids EXAM: TRANSABDOMINAL AND TRANSVAGINAL ULTRASOUND OF PELVIS DOPPLER ULTRASOUND OF OVARIES TECHNIQUE: Both transabdominal and transvaginal ultrasound examinations of the pelvis were performed. Transabdominal technique was performed for global imaging of the pelvis including uterus, ovaries, adnexal regions, and pelvic cul-de-sac. It was necessary to proceed with endovaginal exam following the transabdominal exam to visualize the uterus and adnexa in better detail. Color and duplex Doppler ultrasound was utilized to evaluate blood flow to the ovaries. COMPARISON:  08/02/2021 FINDINGS: Uterus Measurements: 7.8 x 3.9 x 5.2 cm = volume: 87 mL. Heterogeneous with small fundal fibroids noted. At least 3 fundal fibroids are noted all measuring 2.2 cm less in size. Endometrium Thickness: 2.7 mm.  No focal abnormality visualized. Right ovary Measurements: 2.8 x 2.1 x 2.9 cm = volume: 8.8 mL. Normal appearance/no adnexal mass. Left ovary Measurements: 3.2 x 2.0 x 2.2 cm = volume: 7.3 mL. Normal appearance/no adnexal mass. Pulsed Doppler evaluation of both ovaries demonstrates normal low-resistance arterial and venous waveforms. Other findings No abnormal free fluid. IMPRESSION: Small fundal uterine fibroid measuring up to 2.2 cm. No other acute finding by pelvic ultrasound or Doppler.  Electronically Signed   By: Jerilynn Mages.  Shick M.D.   On: 09/25/2021 11:31   US Pelvis Complete  Result Date: 09/25/2021 CLINICAL DATA:  Pelvic and abdominal pain, history of fibroids EXAM: TRANSABDOMINAL AND TRANSVAGINAL ULTRASOUND OF PELVIS DOPPLER ULTRASOUND OF OVARIES TECHNIQUE: Both transabdominal and transvaginal ultrasound examinations of the pelvis were performed. Transabdominal technique was performed for global imaging of the pelvis including uterus, ovaries, adnexal regions, and pelvic  cul-de-sac. It was necessary to proceed with endovaginal exam following the transabdominal exam to visualize the uterus and adnexa in better detail. Color and duplex Doppler ultrasound was utilized to evaluate blood flow to the ovaries. COMPARISON:  08/02/2021 FINDINGS: Uterus Measurements: 7.8 x 3.9 x 5.2 cm = volume: 87 mL. Heterogeneous with small fundal fibroids noted. At least 3 fundal fibroids are noted all measuring 2.2 cm less in size. Endometrium Thickness: 2.7 mm.  No focal abnormality visualized. Right ovary Measurements: 2.8 x 2.1 x 2.9 cm = volume: 8.8 mL. Normal appearance/no adnexal mass. Left ovary Measurements: 3.2 x 2.0 x 2.2 cm = volume: 7.3 mL. Normal appearance/no adnexal mass. Pulsed Doppler evaluation of both ovaries demonstrates normal low-resistance arterial and venous waveforms. Other findings No abnormal free fluid. IMPRESSION: Small fundal uterine fibroid measuring up to 2.2 cm. No other acute finding by pelvic ultrasound or Doppler. Electronically Signed   By: Jerilynn Mages.  Shick M.D.   On: 09/25/2021 11:31   [place] Art/Ven Flow Abd Pelv Doppler  Result Date: 09/25/2021 CLINICAL DATA:  Pelvic and abdominal pain, history of fibroids EXAM: TRANSABDOMINAL AND TRANSVAGINAL ULTRASOUND OF PELVIS DOPPLER ULTRASOUND OF OVARIES TECHNIQUE: Both transabdominal and transvaginal ultrasound examinations of the pelvis were performed. Transabdominal technique was performed for global imaging of the pelvis including uterus,  ovaries, adnexal regions, and pelvic cul-de-sac. It was necessary to proceed with endovaginal exam following the transabdominal exam to visualize the uterus and adnexa in better detail. Color and duplex Doppler ultrasound was utilized to evaluate blood flow to the ovaries. COMPARISON:  08/02/2021 FINDINGS: Uterus Measurements: 7.8 x 3.9 x 5.2 cm = volume: 87 mL. Heterogeneous with small fundal fibroids noted. At least 3 fundal fibroids are noted all measuring 2.2 cm less in size. Endometrium Thickness: 2.7 mm.  No focal abnormality visualized. Right ovary Measurements: 2.8 x 2.1 x 2.9 cm = volume: 8.8 mL. Normal appearance/no adnexal mass. Left ovary Measurements: 3.2 x 2.0 x 2.2 cm = volume: 7.3 mL. Normal appearance/no adnexal mass. Pulsed Doppler evaluation of both ovaries demonstrates normal low-resistance arterial and venous waveforms. Other findings No abnormal free fluid. IMPRESSION: Small fundal uterine fibroid measuring up to 2.2 cm. No other acute finding by pelvic ultrasound or Doppler. Electronically Signed   By: Jerilynn Mages.  Shick M.D.   On: 09/25/2021 11:31    Procedures Procedures   Medications Ordered in ED Medications  sodium chloride 0.9 % bolus 1,000 mL (1,000 mLs Intravenous New Bag/Given 09/25/21 0953)  ondansetron (ZOFRAN) injection 4 mg (4 mg Intravenous Given 09/25/21 0955)  fentaNYL (SUBLIMAZE) injection 25 mcg (25 mcg Intravenous Given 09/25/21 0953)  ketorolac (TORADOL) 30 MG/ML injection 30 mg (30 mg Intravenous Given 09/25/21 0954)    ED Course  I have reviewed the triage vital signs and the nursing notes.  Pertinent labs & imaging results that were available during my care of the patient were reviewed by me and considered in my medical decision making (see chart for details).    MDM Rules/Calculators/A&P                           Diana Butler is a 40 y.o. female here with lower abdominal pain and vomiting.  Consider gastroenteritis versus torsion versus pain from  endometriosis. Plan to get labs and ultrasound pelvis.  Patient wants to hold off on pelvic exam right now.  Has no concerning signs for PID and has no pelvic discharge or bleeding  11:37 AM Labs  unremarkable.  Ultrasound showed small fibroid and no torsion.  Pain is under control now.  Patient has no urinary symptoms.  I think her pain likely is from fibroids.  I reassessed patient and patient now has no tenderness on exam.  I doubt appendicitis. This point, patient is stable for discharge.  Patient does have insurance so I encouraged her to call her insurance company and she may need GYN follow-up   Final Clinical Impression(s) / ED Diagnoses Final diagnoses:  None    Rx / DC Orders ED Discharge Orders     None        Drenda Freeze, MD 09/25/21 1138

## 2021-09-25 NOTE — ED Triage Notes (Signed)
Pt presents with c/o abdominal pain that started this morning. Pt also reports nausea with the pain.

## 2021-11-03 ENCOUNTER — Encounter: Payer: Self-pay | Admitting: Obstetrics & Gynecology

## 2021-11-03 ENCOUNTER — Other Ambulatory Visit: Payer: Self-pay

## 2021-11-03 ENCOUNTER — Ambulatory Visit (INDEPENDENT_AMBULATORY_CARE_PROVIDER_SITE_OTHER): Payer: 59 | Admitting: Obstetrics & Gynecology

## 2021-11-03 VITALS — BP 138/92 | HR 62 | Ht 61.75 in | Wt 150.0 lb

## 2021-11-03 DIAGNOSIS — N940 Mittelschmerz: Secondary | ICD-10-CM

## 2021-11-03 DIAGNOSIS — D251 Intramural leiomyoma of uterus: Secondary | ICD-10-CM

## 2021-11-03 DIAGNOSIS — Z9889 Other specified postprocedural states: Secondary | ICD-10-CM | POA: Diagnosis not present

## 2021-11-03 MED ORDER — NORETHINDRONE 0.35 MG PO TABS
1.0000 | ORAL_TABLET | Freq: Every day | ORAL | 4 refills | Status: DC
Start: 2021-11-03 — End: 2021-12-10

## 2021-11-03 NOTE — Progress Notes (Signed)
    Diana Butler [birth year] [id number]        40 y.o.  [id number]   RP: Cyclic monthly Rt pelvic pains  HPI: S/P Endometrial Ablation 02/2020.  Amenorrhea since then.  Cyclic monthly Rt pelvic pains similar to her previous Rt Ovulatory pains.   OB History  Gravida Para Term Preterm AB Living  2       2 0  SAB IAB Ectopic Multiple Live Births      2        # Outcome Date GA Lbr Len/2nd Weight Sex Delivery Anes PTL Lv  2 Ectopic           1 Ectopic             Past medical history,surgical history, problem list, medications, allergies, family history and social history were all reviewed and documented in the EPIC chart.   Directed ROS with pertinent positives and negatives documented in the history of present illness/assessment and plan.  Exam:  Vitals:   11/03/21 1602  BP: (!) 138/92  Pulse: 62  SpO2: 98%  Weight: 150 lb (68 kg)  Height: 5' 1.75" (1.568 m)   General appearance:  Normal  Abdomen: Normal  Gynecologic exam: Vulva normal.  Bimanual exam:  AV uterus, normal volume, mobile, NT.  No adnexal mass, NT bilaterally.   Pelvic US 09/25/2021: Uterus: Measurements: 7.8 x 3.9 x 5.2 cm = volume: 87 mL. Heterogeneous with small fundal fibroids noted. At least 3 fundal fibroids are noted all measuring 2.2 cm less in size. Endometrium: Thickness: 2.7 mm.  No focal abnormality visualized. Right ovary: Measurements: 2.8 x 2.1 x 2.9 cm = volume: 8.8 mL. Normal appearance/no adnexal mass. Left ovary: Measurements: 3.2 x 2.0 x 2.2 cm = volume: 7.3 mL. Normal appearance/no adnexal mass. Pulsed Doppler evaluation of both ovaries demonstrates normal low-resistance arterial and venous waveforms. Other findings: No abnormal free fluid.  Patho 02/2020: FINAL MICROSCOPIC DIAGNOSIS:   A. ENDOMETRIUM, CURETTAGE:  -  Polypoid fragments of benign endometrium with exogenous hormone  effect  -  Secretory endometrium  -  Fragments of benign smooth muscle  -  No hyperplasia or malignancy  identified    Assessment/Plan:  40 y.o. G2P0020   1. Ovulatory pain S/P Endometrial Ablation 02/2020.  Amenorrhea since then.  Cyclic monthly Rt pelvic pains similar to her previous Rt Ovulatory pains. Decision to block ovulation with the Progestin only BCPs. Risks/Benefits reviewed.  No CI.  Usage reviewed and prescription sent to pharmacy.  2. S/P endometrial ablation  3. Fibroids, intramural As above, reassured.  Other orders - norethindrone (MICRONOR) 0.35 MG tablet; Take 1 tablet (0.35 mg total) by mouth daily.   Princess Bruins MD, 4:43 PM 11/03/2021

## 2021-11-04 ENCOUNTER — Encounter: Payer: Self-pay | Admitting: Obstetrics & Gynecology

## 2021-12-10 ENCOUNTER — Ambulatory Visit (INDEPENDENT_AMBULATORY_CARE_PROVIDER_SITE_OTHER): Payer: Self-pay | Admitting: Obstetrics & Gynecology

## 2021-12-10 ENCOUNTER — Other Ambulatory Visit: Payer: Self-pay

## 2021-12-10 ENCOUNTER — Encounter (HOSPITAL_COMMUNITY): Payer: Self-pay | Admitting: Emergency Medicine

## 2021-12-10 ENCOUNTER — Encounter: Payer: Self-pay | Admitting: Obstetrics & Gynecology

## 2021-12-10 ENCOUNTER — Emergency Department (HOSPITAL_COMMUNITY)
Admission: EM | Admit: 2021-12-10 | Discharge: 2021-12-10 | Disposition: A | Discharge status: Home / Self Care | Payer: Medicaid Other | Attending: Emergency Medicine | Admitting: Emergency Medicine

## 2021-12-10 VITALS — BP 110/64

## 2021-12-10 DIAGNOSIS — N898 Other specified noninflammatory disorders of vagina: Secondary | ICD-10-CM

## 2021-12-10 DIAGNOSIS — R21 Rash and other nonspecific skin eruption: Secondary | ICD-10-CM

## 2021-12-10 DIAGNOSIS — N940 Mittelschmerz: Secondary | ICD-10-CM

## 2021-12-10 LAB — WET PREP FOR TRICH, YEAST, CLUE

## 2021-12-10 MED ORDER — FLUCONAZOLE 150 MG PO TABS: 150.0000 mg | ORAL_TABLET | Freq: Once | ORAL | 1 refills | Status: AC

## 2021-12-10 MED ORDER — TINIDAZOLE 500 MG PO TABS: 1000.0000 mg | ORAL_TABLET | Freq: Two times a day (BID) | ORAL | 0 refills | Status: AC

## 2021-12-10 MED ORDER — NORETHINDRONE 0.35 MG PO TABS: 1.0000 | ORAL_TABLET | Freq: Every day | ORAL | 4 refills | Status: DC

## 2021-12-10 MED ORDER — PREDNISONE 20 MG PO TABS: 40.0000 mg | ORAL_TABLET | Freq: Every day | ORAL | 0 refills | Status: DC

## 2021-12-10 NOTE — ED Triage Notes (Addendum)
Per pt, states rash all over body-started 1 week ago-no new meds-has used a new detergent

## 2021-12-10 NOTE — ED Provider Notes (Signed)
[facility] DEPT Provider Note   CSN: [id number] Arrival date & time: 12/10/21  1011     History  Chief Complaint  Patient presents with   Rash    Diana Butler is a 41 y.o. female.   Rash Associated symptoms: no shortness of breath   Patient presents with a rash.  Is had for last 2 to 3 days.  Over most of her body.  Is very itchy.  Started on her chest below her breasts.  No fevers.  Does have a history of some allergies.  Did use a new body wash and new detergent.  No difficulty breathing.  No swelling in his mouth.  Apparently has been taking Benadryl without relief.  Saw OB/GYN earlier today but did not inform me of this.    Home Medications Prior to Admission medications   Medication Sig Start Date End Date Taking? Authorizing Provider  predniSONE (DELTASONE) 20 MG tablet Take 2 tablets (40 mg total) by mouth daily. 12/10/21  Yes Davonna Belling, MD  fluconazole (DIFLUCAN) 150 MG tablet Take 1 tablet (150 mg total) by mouth once for 1 dose. 12/10/21 12/10/21  Princess Bruins, MD  norethindrone (MICRONOR) 0.35 MG tablet Take 1 tablet (0.35 mg total) by mouth daily. 12/10/21   Princess Bruins, MD  tinidazole (TINDAMAX) 500 MG tablet Take 2 tablets (1,000 mg total) by mouth 2 (two) times daily for 2 days. 12/10/21 12/12/21  Princess Bruins, MD  ferrous sulfate 325 (65 FE) MG tablet Take 1 tablet (325 mg total) by mouth 2 (two) times daily with a meal. TAKE WITH ORANGE JUICE Patient not taking: Reported on 10/18/2019 01/08/18 12/05/20  Street, Reubens, PA-C      Allergies    Bee venom    Review of Systems   Review of Systems  Constitutional:  Negative for appetite change.  Respiratory:  Negative for shortness of breath.   Skin:  Positive for rash.   Physical Exam Updated Vital Signs BP (!) 141/91 (BP Location: Left Arm)    Pulse 65    Temp 99.1 F (37.3 C) (Oral)    Resp 18    SpO2 100%  Physical Exam Vitals reviewed.  HENT:     Head:  Normocephalic.  Pulmonary:     Breath sounds: No wheezing.  Chest:     Chest wall: No tenderness.  Abdominal:     Tenderness: There is no abdominal tenderness.  Musculoskeletal:     Cervical back: Neck supple.  Skin:    Capillary Refill: Capillary refill takes less than 2 seconds.     Comments: Diffuse rash over body and extremities.  Appears to be hives.  No induration.  No fluctuance.  No involvement of mucous membranes or palms.  Neurological:     Mental Status: She is alert.    ED Results / Procedures / Treatments   Labs (all labs ordered are listed, but only abnormal results are displayed) Labs Reviewed - No data to display  EKG None  Radiology No results found.  Procedures Procedures    Medications Ordered in ED Medications - No data to display  ED Course/ Medical Decision Making/ A&P                           Medical Decision Making Risk Prescription drug management.   Patient with likely allergic reaction or contact dermatitis.  Had new soap and new detergent.  No involvement of mouth.  Less  involvement of face.  Does not go under her underwear also.  Well-appearing.  Will give short course of steroids.  Does not appear to be fungal.  Not appear to be infectious.  Follow-up with PCP or dermatology as needed.  Also reportedly had had some vaginal discharge had been treated by OB/GYN today.        Final Clinical Impression(s) / ED Diagnoses Final diagnoses:  Rash and nonspecific skin eruption    Rx / DC Orders ED Discharge Orders          Ordered    predniSONE (DELTASONE) 20 MG tablet  Daily        12/10/21 1323              Davonna Belling, MD 12/10/21 1332

## 2021-12-10 NOTE — ED Notes (Signed)
I provided reinforced discharge education based off of discharge instructions. Pt acknowledged and understood my education. Pt had no further questions/concerns for provider/myself.  °

## 2021-12-10 NOTE — Progress Notes (Signed)
° ° °  Diana Butler July 30, 1981 [id number]        40 y.o.  G2P0020   RP: Vaginal discharge with widespread skin rash  HPI: Vaginal discharge with widespread skin rash.  The generalized skin rash started after eating a Chicken Pot Pie and changing clothes detergent.  No current pelvic pain, but h/o Ovulatory pain, would like to start on the Progestin Pill as previously discussed.   OB History  Gravida Para Term Preterm AB Living  2       2 0  SAB IAB Ectopic Multiple Live Births      2        # Outcome Date GA Lbr Len/2nd Weight Sex Delivery Anes PTL Lv  2 Ectopic           1 Ectopic             Past medical history,surgical history, problem list, medications, allergies, family history and social history were all reviewed and documented in the EPIC chart.   Directed ROS with pertinent positives and negatives documented in the history of present illness/assessment and plan.  Exam:  Vitals:   12/10/21 0904  BP: 110/64  Weight: 164 lb (74.4 kg)  Height: 5' 1.75" (1.568 m)   General appearance:  Normal  Skin:  Widespread raised rash  Gynecologic exam: Vulva normal.  Speculum:  Cervix normal.  Vagina normal.  Mild increase in d/c.  Wet prep done.  Gono-Chlam done.  Wet prep: Clue cells and odor present   Assessment/Plan:  41 y.o. G2P0020   1. Vaginal discharge Bacterial Vaginosis confirmed by Wet prep.  Will treat with Tinidazole.  Usage reviewed and prescription sent to pharmacy.  R/O STI.   - WET PREP FOR [place], YEAST, CLUE - Gono-Chlam  2. Skin rash Generalized skin rash.  Patient changed detergent recently and ate a Chicken Pot Pie just before developing the rash.  Will avoid those possible Allergens at this time.  Benadryl and Calamine lotion as needed.  If no improvement, recommend evaluation/management with Dermato.  3. Ovulatory pain H/O Ovulatory pain.  Will manage with the Progestin-only BCP.  No CI.  Usage reviewed and prescription sent to pharmacy.  Other  orders - norethindrone (MICRONOR) 0.35 MG tablet; Take 1 tablet (0.35 mg total) by mouth daily. - tinidazole (TINDAMAX) 500 MG tablet; Take 2 tablets (1,000 mg total) by mouth 2 (two) times daily for 2 days. - fluconazole (DIFLUCAN) 150 MG tablet; Take 1 tablet (150 mg total) by mouth once for 1 dose.   Princess Bruins MD, 9:27 AM 12/10/2021

## 2021-12-11 LAB — C. TRACHOMATIS/N. GONORRHOEAE RNA
C. trachomatis RNA, TMA: NOT DETECTED
N. gonorrhoeae RNA, TMA: NOT DETECTED

## 2022-01-05 ENCOUNTER — Ambulatory Visit: Payer: 59 | Admitting: Obstetrics & Gynecology

## 2022-01-05 DIAGNOSIS — Z0289 Encounter for other administrative examinations: Secondary | ICD-10-CM

## 2022-03-26 IMAGING — US US PELVIS COMPLETE TRANSABD/TRANSVAG W DUPLEX
1 series · 13 of 25 positions shown · non-contrast
Comparison: CT abdomen pelvis 12/05/2020, ultrasound pelvis
12/26/2019

CLINICAL DATA: Right lower quadrant pain since yesterday.
Endometrial ablation on 02/07/2020. Not on tamoxifen or hormone
therapy. Patient is premenopausal.

EXAM:
TRANSABDOMINAL AND TRANSVAGINAL ULTRASOUND OF PELVIS
DOPPLER ULTRASOUND OF OVARIES
TECHNIQUE: Both transabdominal and transvaginal ultrasound examinations of the
pelvis were performed. Transabdominal technique was performed for
global imaging of the pelvis including uterus, ovaries, adnexal
regions, and pelvic cul-de-sac.
It was necessary to proceed with endovaginal exam following the
transabdominal exam to visualize the endometrium and bilateral
ovaries. Color and duplex Doppler ultrasound was utilized to
evaluate blood flow to the ovaries.

[Series 1: us pelvis complete transabd/transvag w duplex · 13 of 206 slices shown]
[im 1/206]
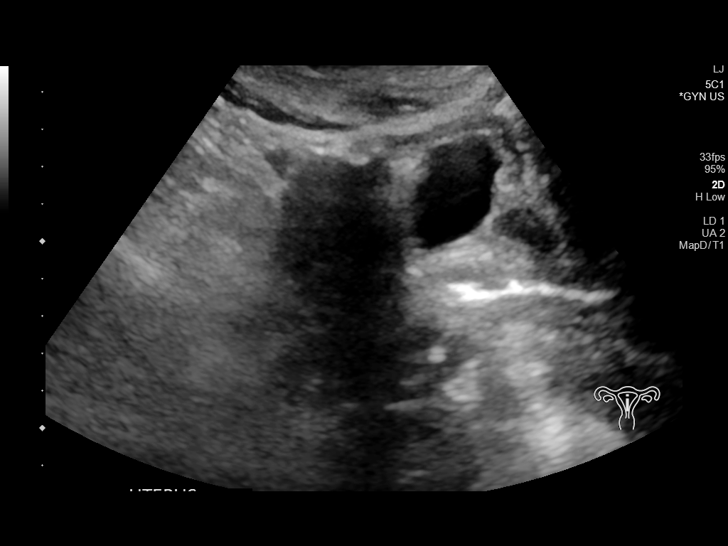
[im 18/206]
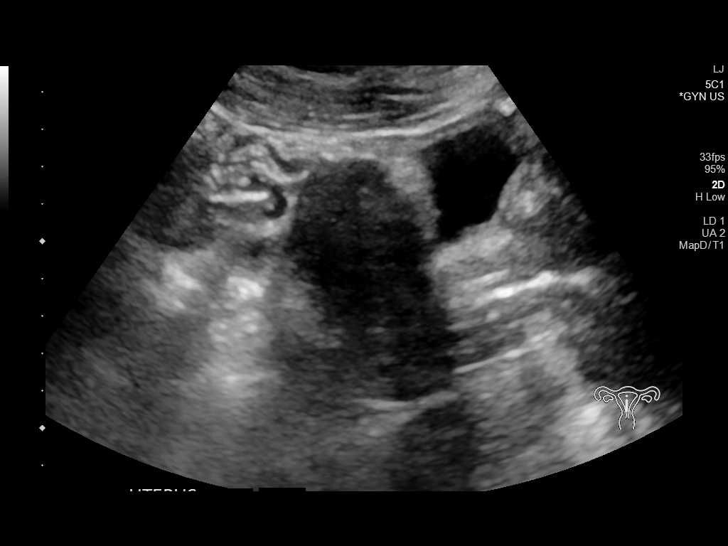
[im 35/206]
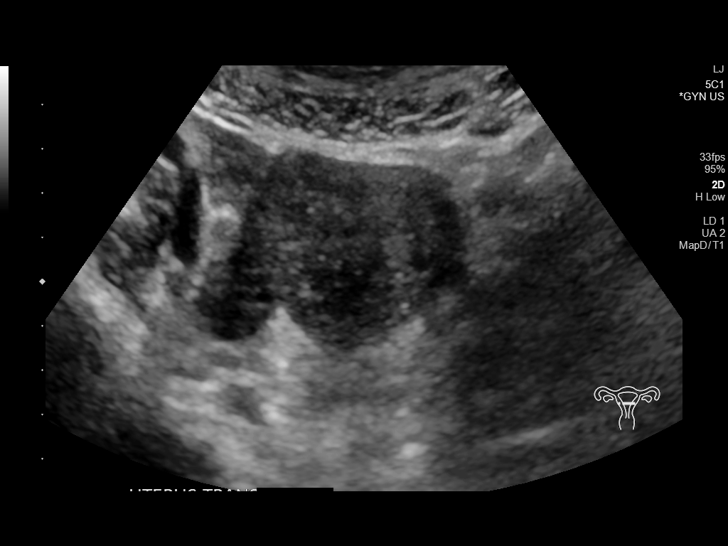
[im 52/206]
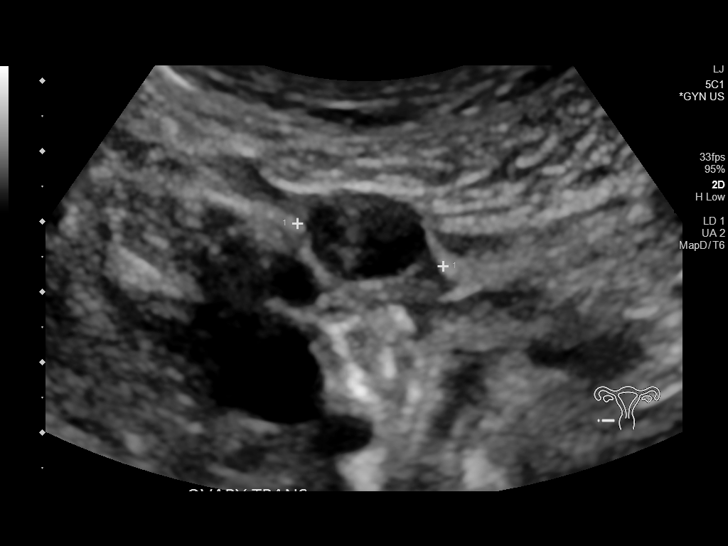
[im 69/206]
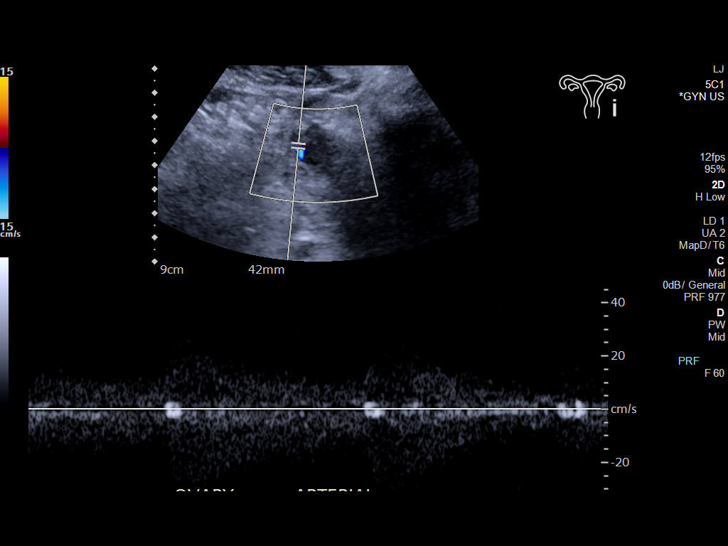
[im 86/206]
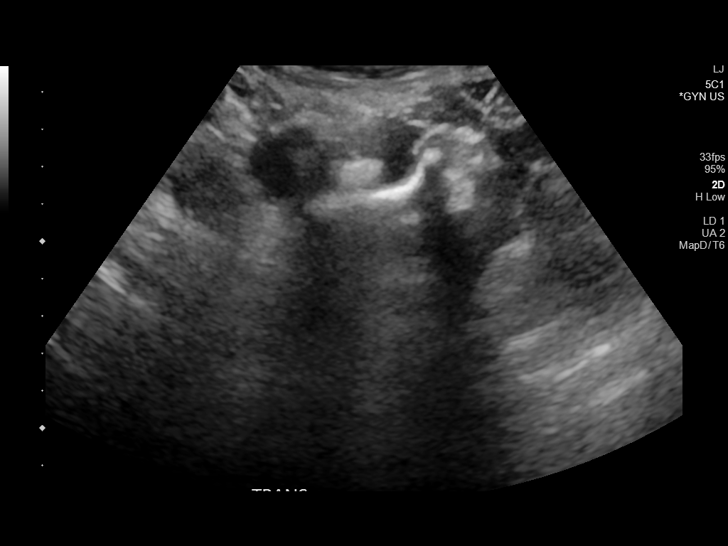
[im 103/206]
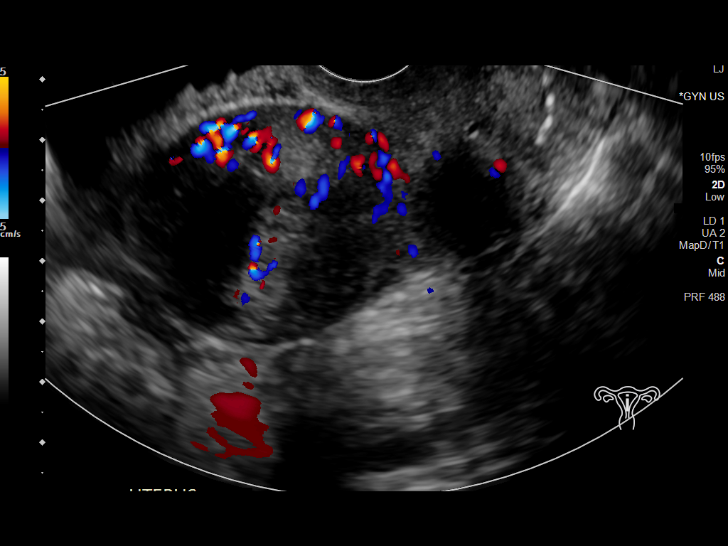
[im 120/206]
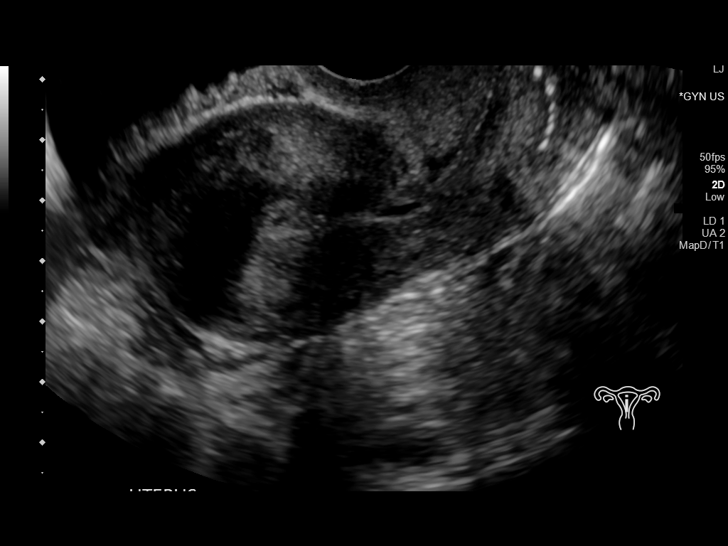
[im 137/206]
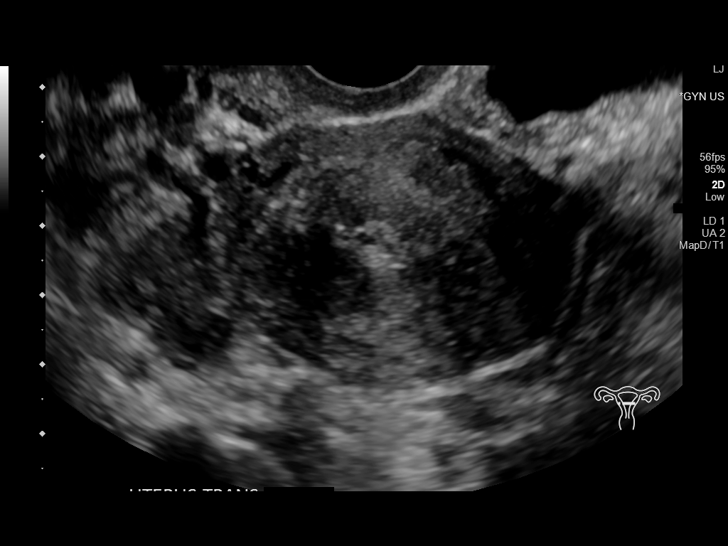
[im 154/206]
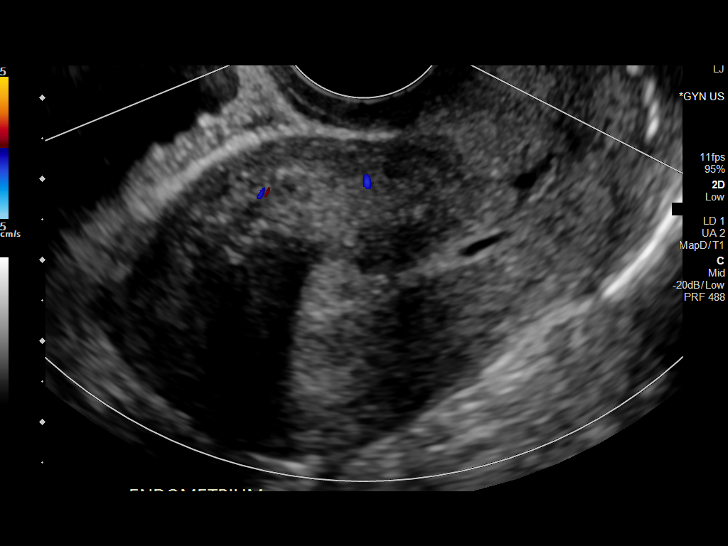
[im 171/206]
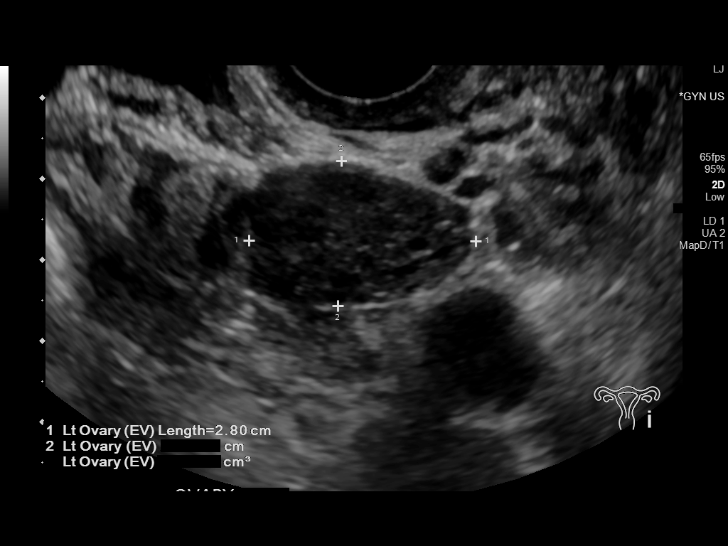
[im 188/206]
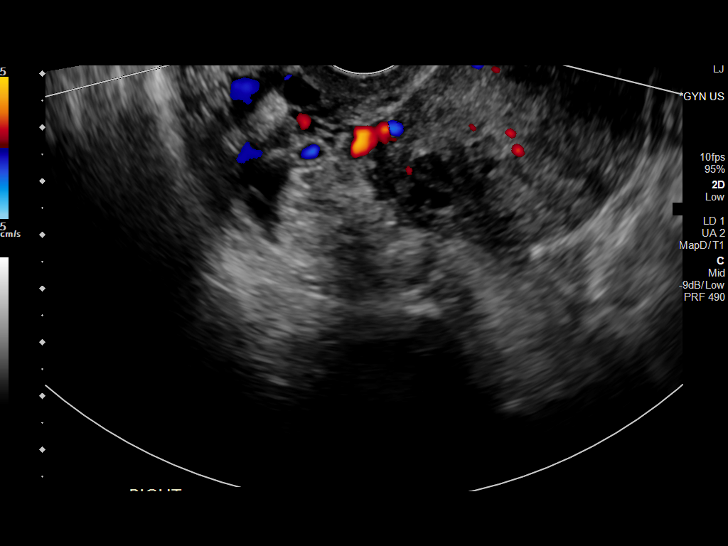
[im 206/206]
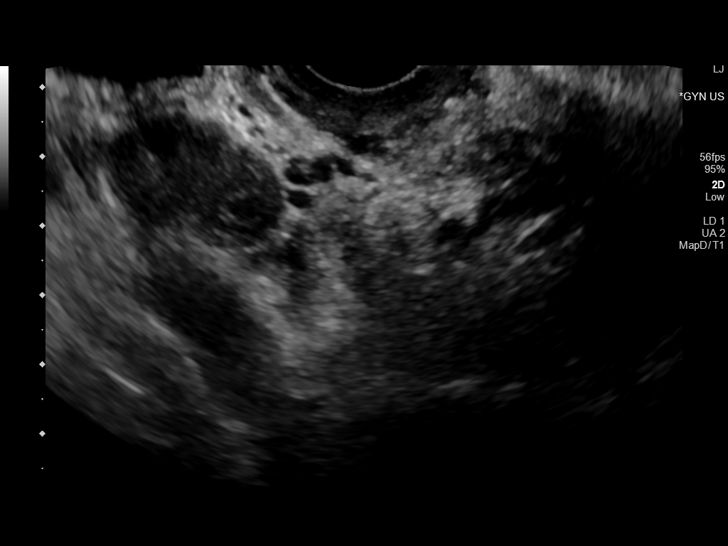

[13 of 25 positions shown; findings below may reference images not displayed]

FINDINGS: Uterus

Measurements: 8.1 x 3.8 x 4.8 cm = volume: 77 mL. Total of 3
hypoechoic intramural uterine masses are noted likely representing
fibroids: 1.1 x 0.9 x 1.1 cm, 0.9 x 1.2 x 1.1 cm, and 0.9 x 0.8 x
0.7 cm. These lesions have not increased in size. No other mass
visualized.

Endometrium

Thickness: 8 mm.  No focal abnormality visualized.

Right ovary

Measurements: 3.4 x 1.9 x 2.7 cm = volume: 9.4 mL. Normal
appearance/no adnexal mass.

Left ovary

Measurements: 2.8 x 1.8 x 2.3 cm = volume: 5.9 mL. Normal
appearance/no adnexal mass.

Pulsed Doppler evaluation of both ovaries demonstrates normal
low-resistance arterial and venous waveforms.

Other findings

No abnormal free fluid.
IMPRESSION: 1. Pericentimeter intramural uterine fibroids.
2. Otherwise unremarkable pelvic ultrasound.

## 2022-04-09 ENCOUNTER — Ambulatory Visit: Payer: PRIVATE HEALTH INSURANCE | Admitting: Obstetrics & Gynecology

## 2022-04-09 DIAGNOSIS — Z0289 Encounter for other administrative examinations: Secondary | ICD-10-CM

## 2022-04-10 ENCOUNTER — Ambulatory Visit: Payer: PRIVATE HEALTH INSURANCE | Admitting: Obstetrics & Gynecology

## 2022-04-10 DIAGNOSIS — Z0289 Encounter for other administrative examinations: Secondary | ICD-10-CM

## 2022-05-19 IMAGING — US US ART/VEN ABD/PELV/SCROTUM DOPPLER LTD
1 series · 13 of 25 positions shown · non-contrast
Comparison: 08/02/2021

CLINICAL DATA: Pelvic and abdominal pain, history of fibroids

EXAM:
TRANSABDOMINAL AND TRANSVAGINAL ULTRASOUND OF PELVIS
DOPPLER ULTRASOUND OF OVARIES
TECHNIQUE: Both transabdominal and transvaginal ultrasound examinations of the
pelvis were performed. Transabdominal technique was performed for
global imaging of the pelvis including uterus, ovaries, adnexal
regions, and pelvic cul-de-sac.
It was necessary to proceed with endovaginal exam following the
transabdominal exam to visualize the uterus and adnexa in better
detail. Color and duplex Doppler ultrasound was utilized to evaluate
blood flow to the ovaries.

[Series 1: us art/ven abd/pelv/scrotum doppler ltd · 13 of 221 slices shown]
[im 1/221]
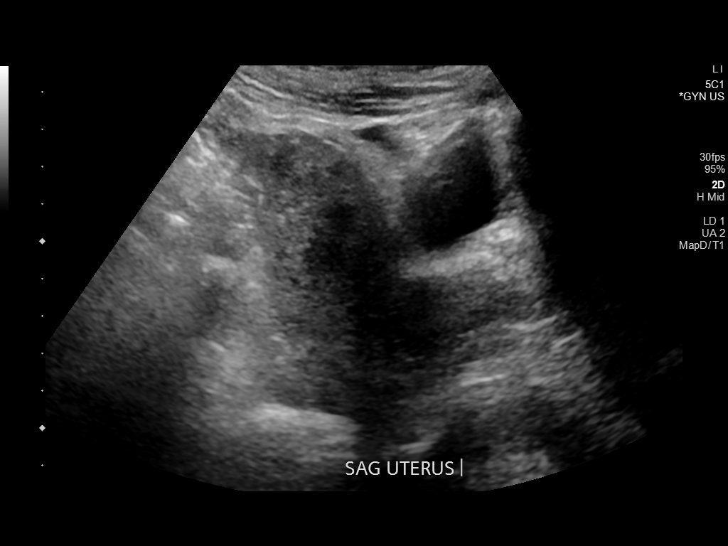
[im 19/221]
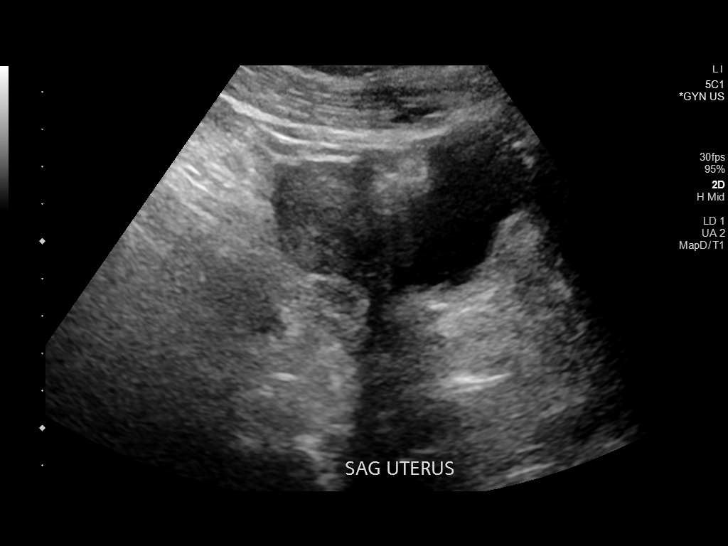
[im 37/221]
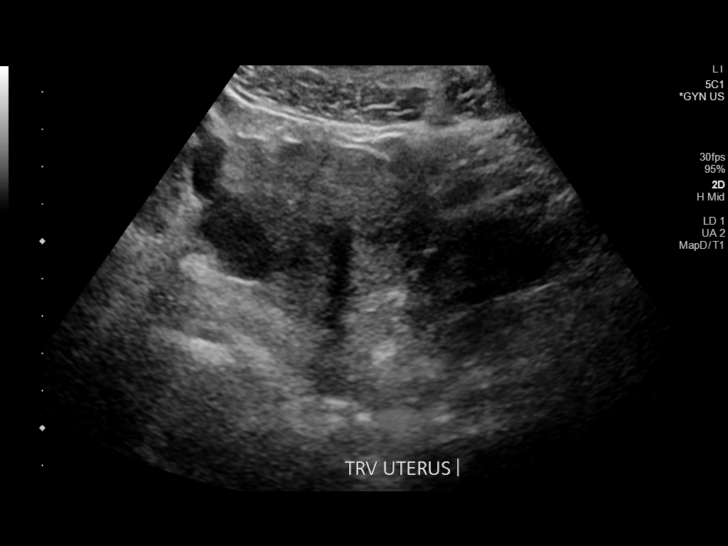
[im 56/221]
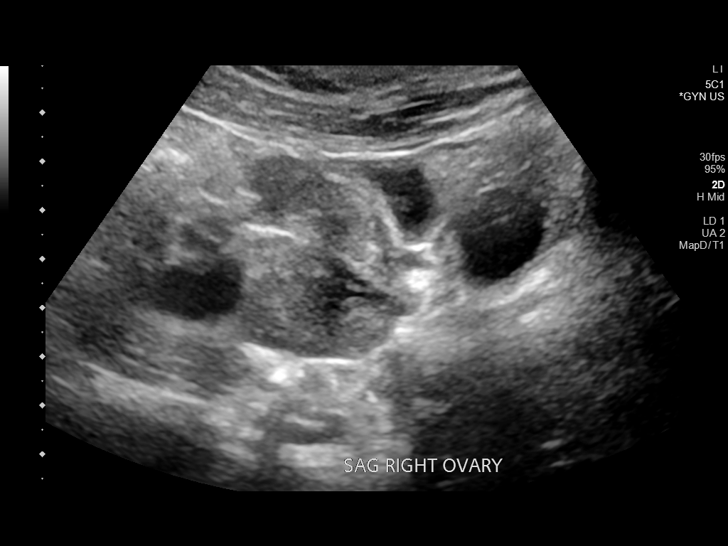
[im 74/221]
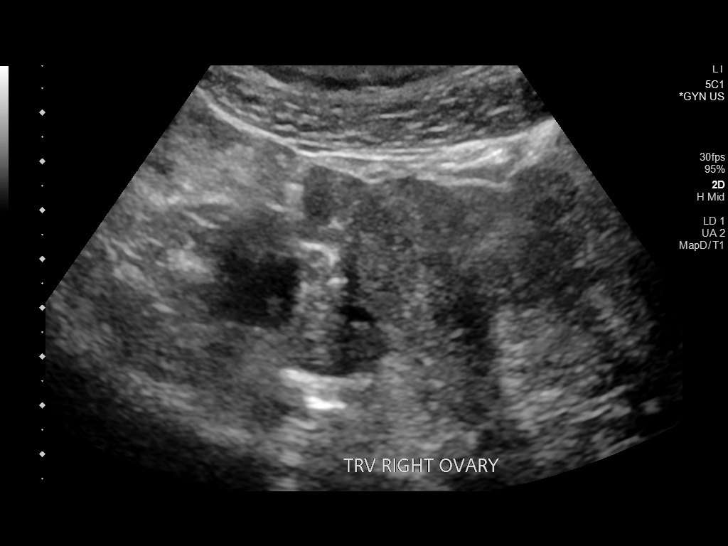
[im 92/221]
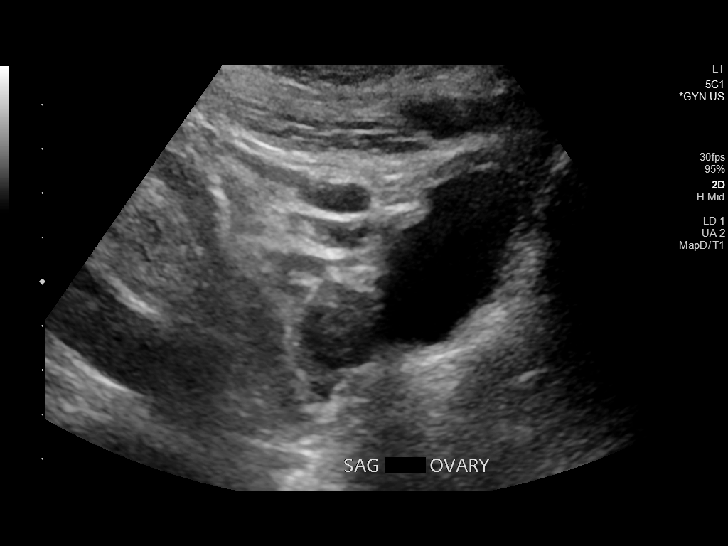
[im 111/221]
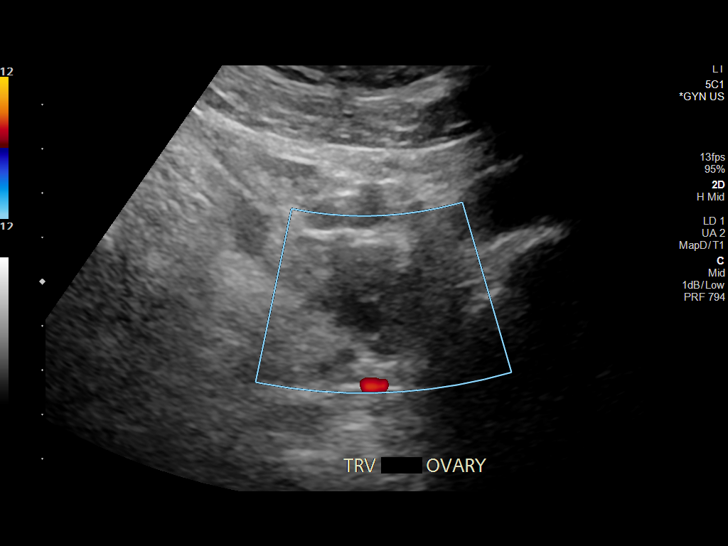
[im 129/221]
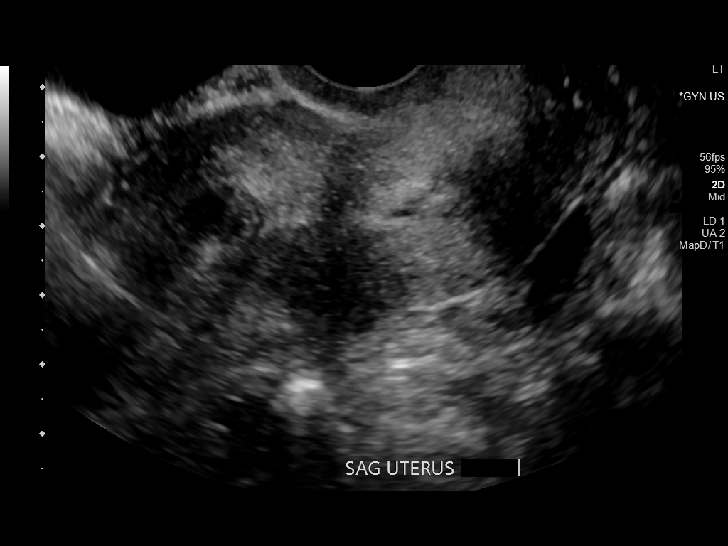
[im 147/221]
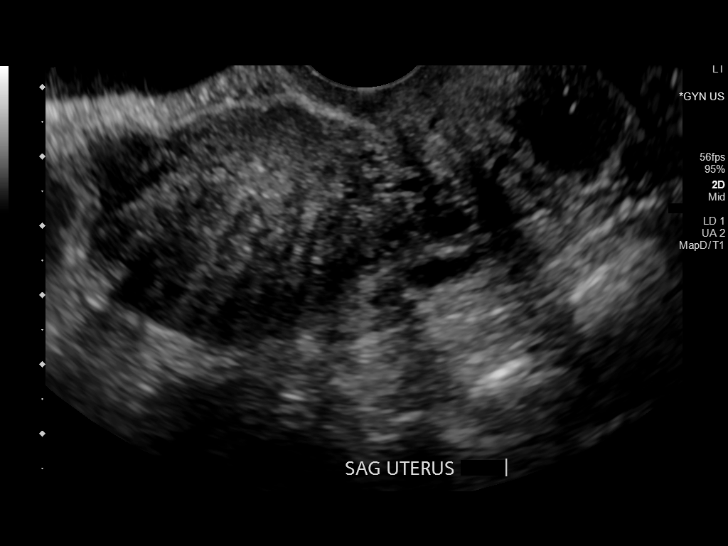
[im 166/221]
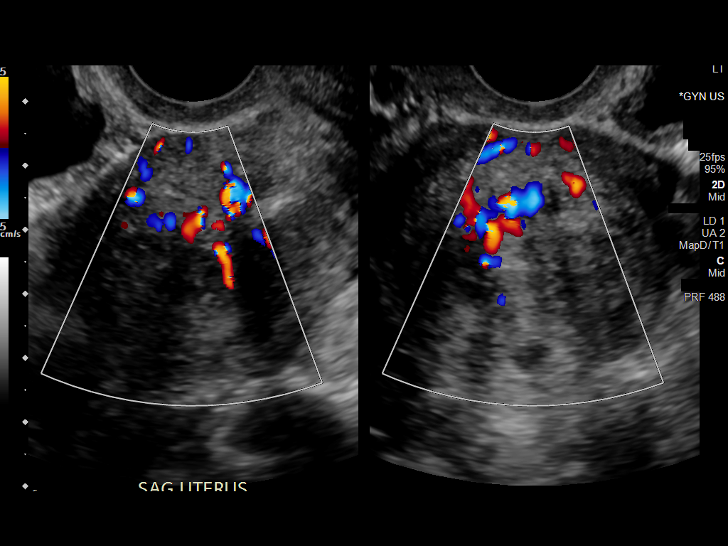
[im 184/221]
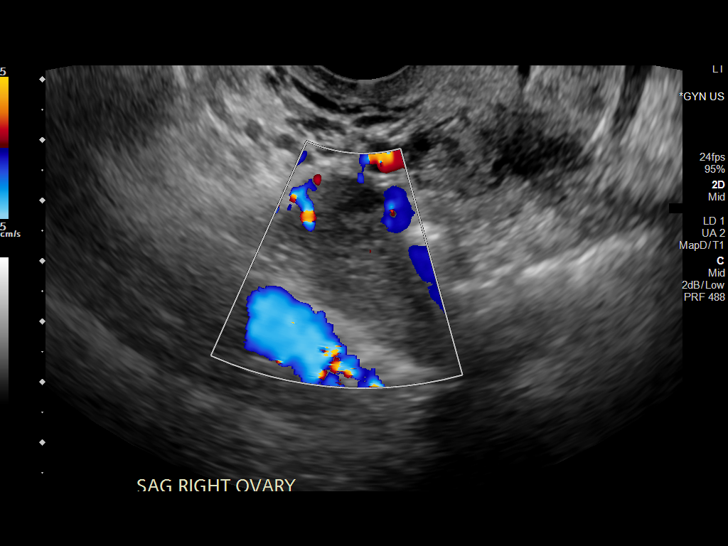
[im 202/221]
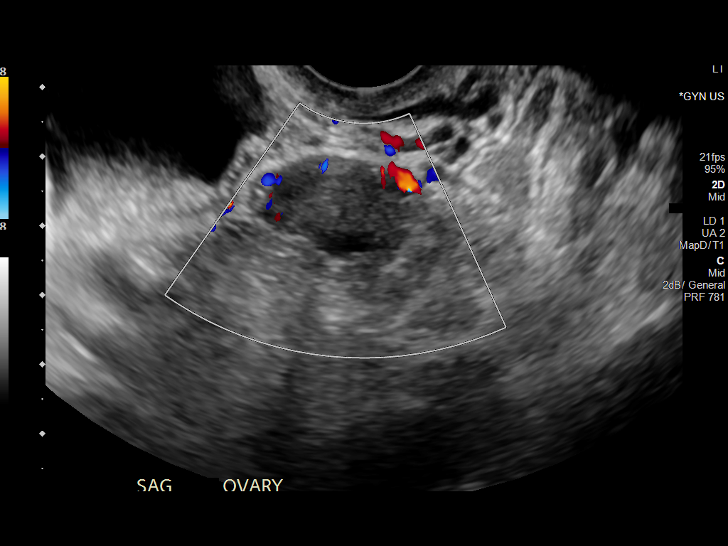
[im 221/221]
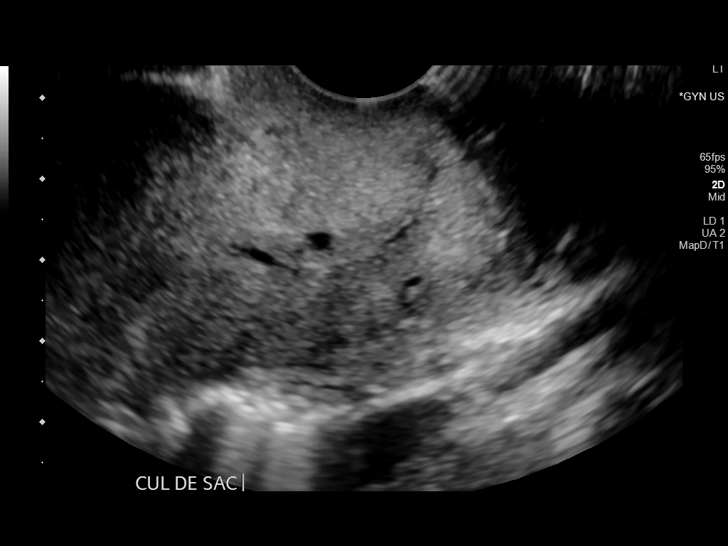

[13 of 25 positions shown; findings below may reference images not displayed]

FINDINGS: Uterus

Measurements: 7.8 x 3.9 x 5.2 cm = volume: 87 mL. Heterogeneous with
small fundal fibroids noted. At least 3 fundal fibroids are noted
all measuring 2.2 cm less in size.

Endometrium

Thickness: 2.7 mm.  No focal abnormality visualized.

Right ovary

Measurements: 2.8 x 2.1 x 2.9 cm = volume: 8.8 mL. Normal
appearance/no adnexal mass.

Left ovary

Measurements: 3.2 x 2.0 x 2.2 cm = volume: 7.3 mL. Normal
appearance/no adnexal mass.

Pulsed Doppler evaluation of both ovaries demonstrates normal
low-resistance arterial and venous waveforms.

Other findings

No abnormal free fluid.
IMPRESSION: Small fundal uterine fibroid measuring up to 2.2 cm.

No other acute finding by pelvic ultrasound or Doppler.

## 2022-05-19 IMAGING — US US PELVIS COMPLETE
2 series · 13 of 25 positions shown · non-contrast
Comparison: 08/02/2021

CLINICAL DATA: Pelvic and abdominal pain, history of fibroids

EXAM:
TRANSABDOMINAL AND TRANSVAGINAL ULTRASOUND OF PELVIS
DOPPLER ULTRASOUND OF OVARIES
TECHNIQUE: Both transabdominal and transvaginal ultrasound examinations of the
pelvis were performed. Transabdominal technique was performed for
global imaging of the pelvis including uterus, ovaries, adnexal
regions, and pelvic cul-de-sac.
It was necessary to proceed with endovaginal exam following the
transabdominal exam to visualize the uterus and adnexa in better
detail. Color and duplex Doppler ultrasound was utilized to evaluate
blood flow to the ovaries.

[Series 1: us pelvis complete · 12 of 221 slices shown (1 of 2)]
[im 1/221]
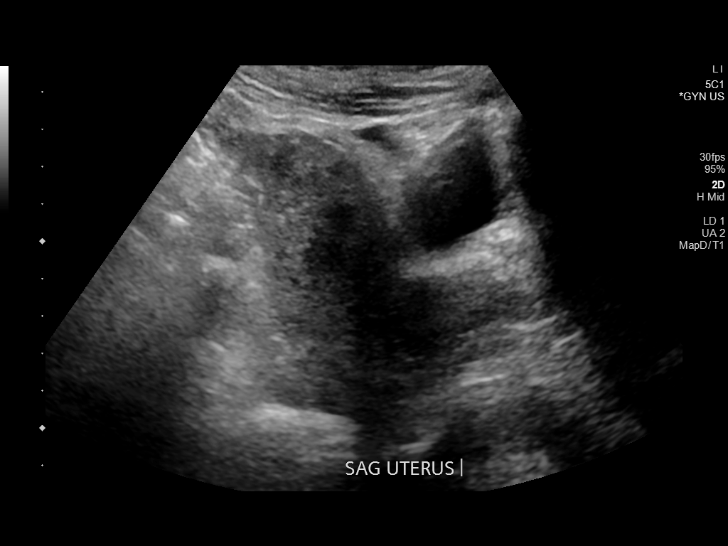
[im 20/221]
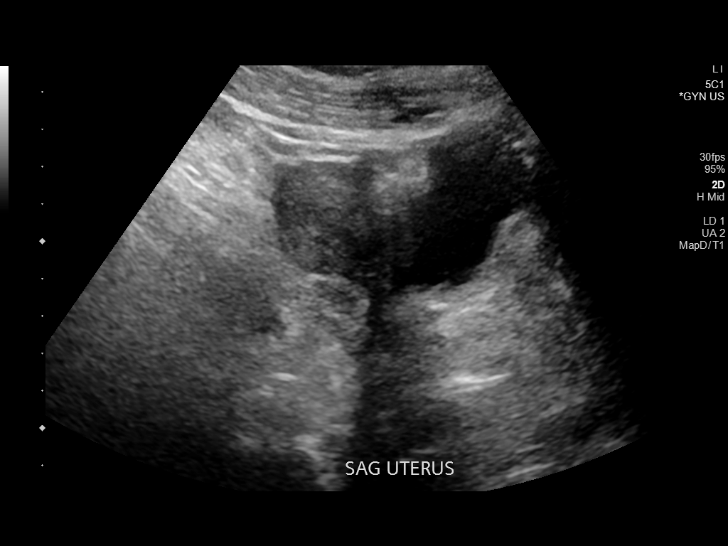
[im 39/221]
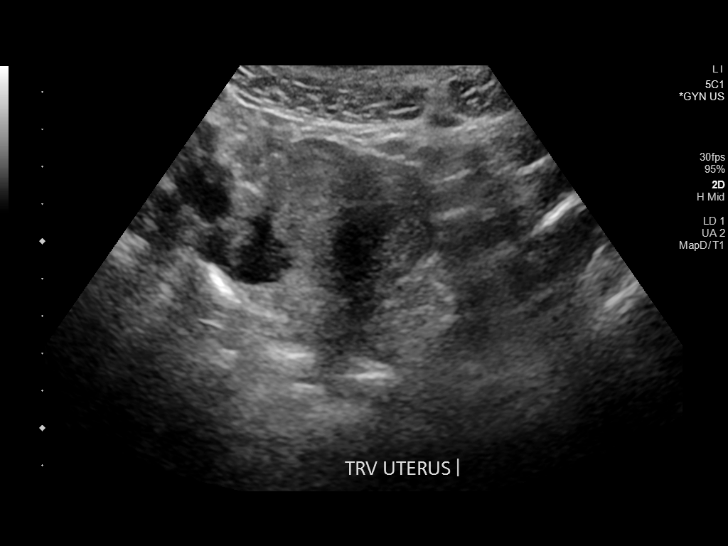
[im 58/221]
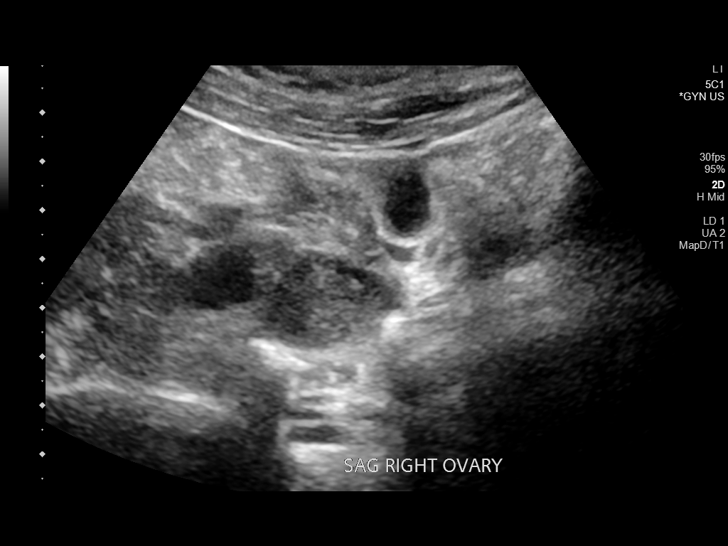
[im 77/221]
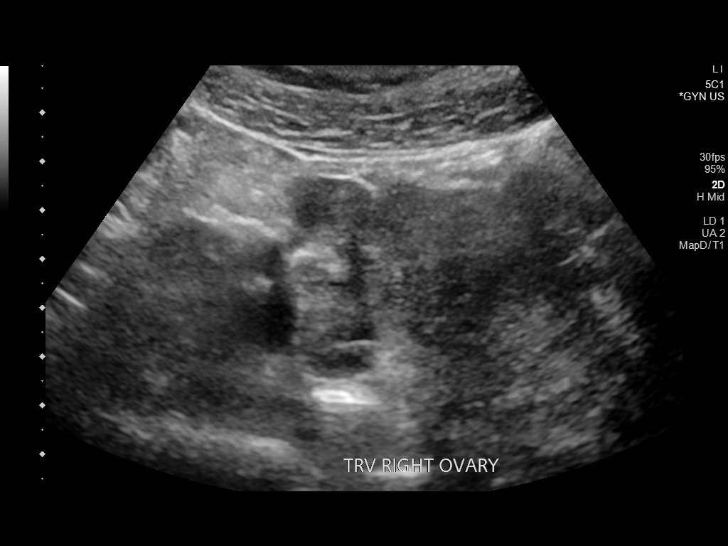
[im 96/221]
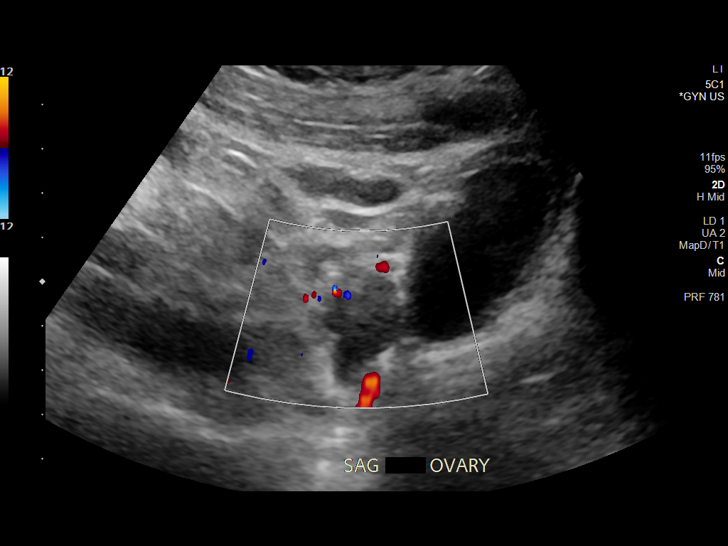
[im 115/221]
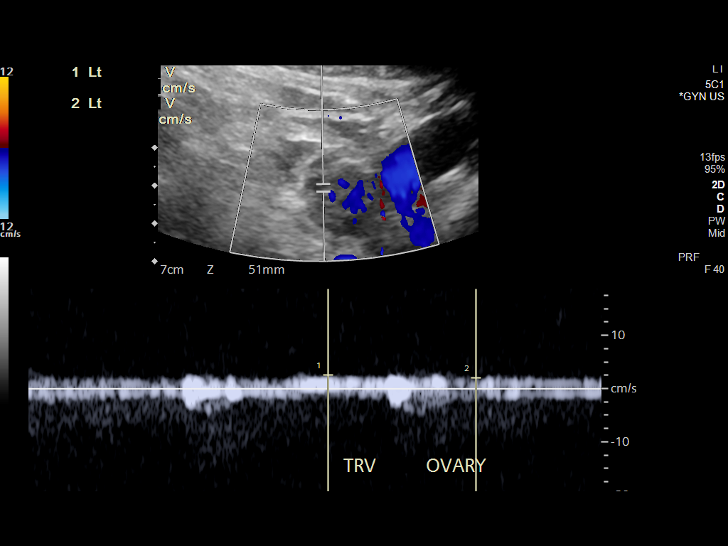
[im 134/221]
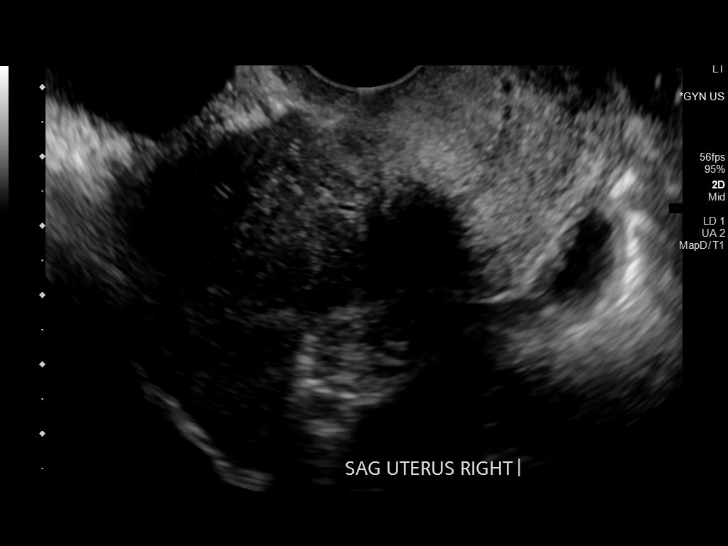
[im 154/221]
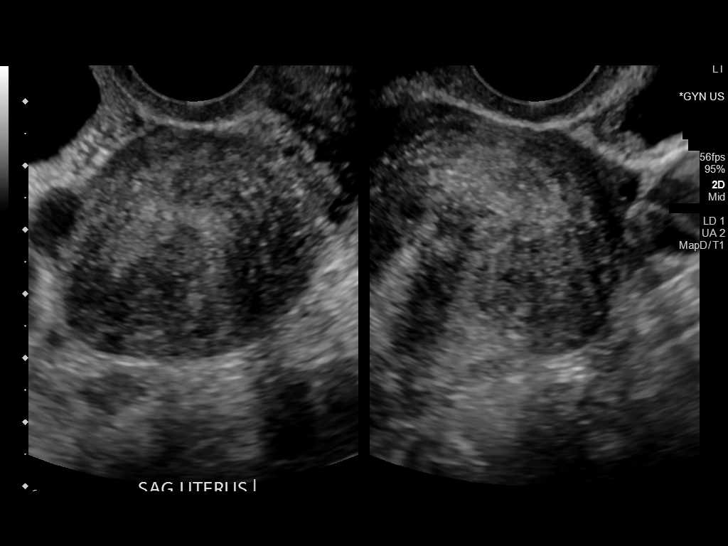
[im 173/221]
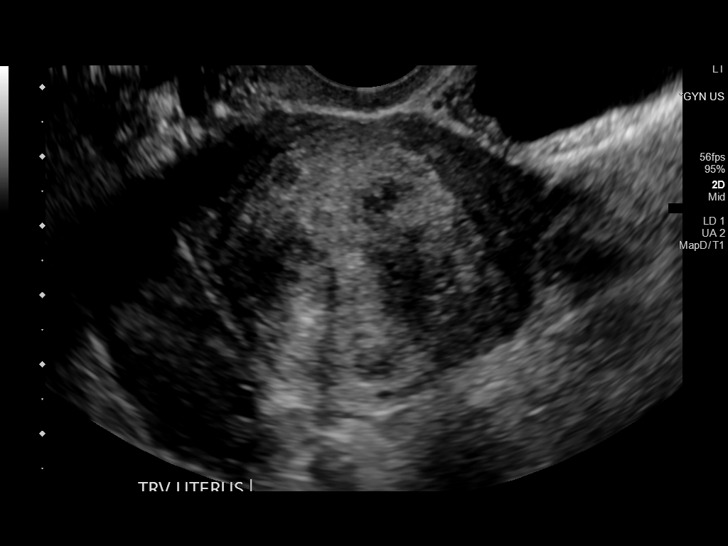
[im 192/221]
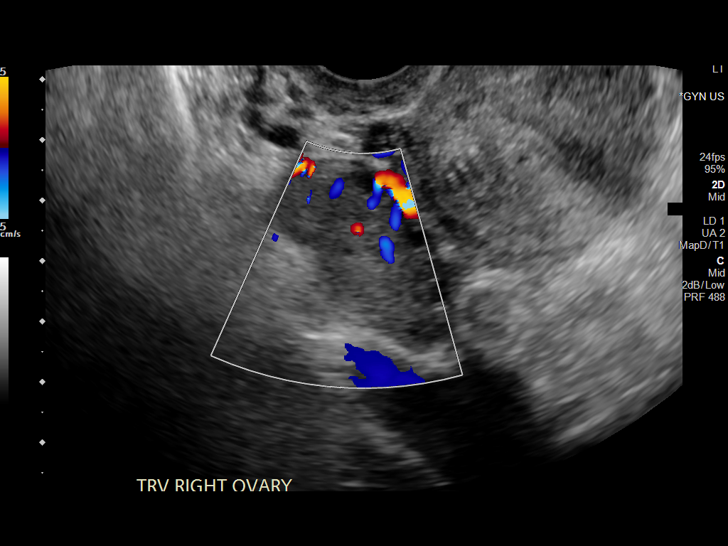
[im 211/221]
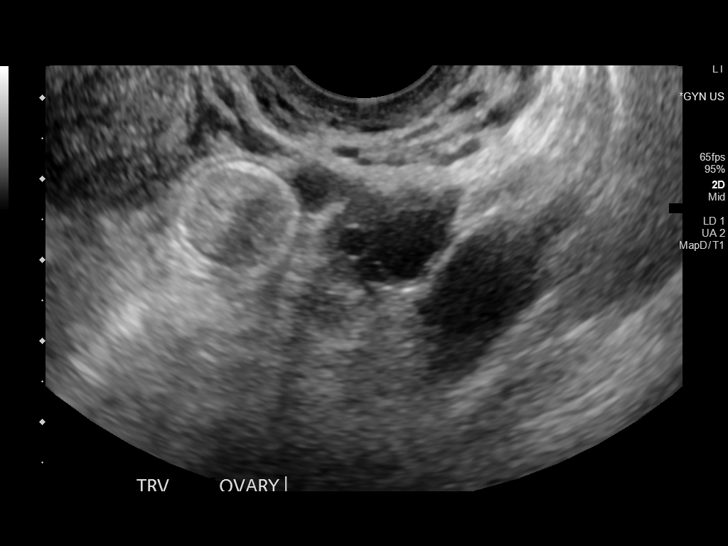

[Series 1001: us pelvis complete · 1 of 1 slices shown (2 of 2)]
[im 1/1]
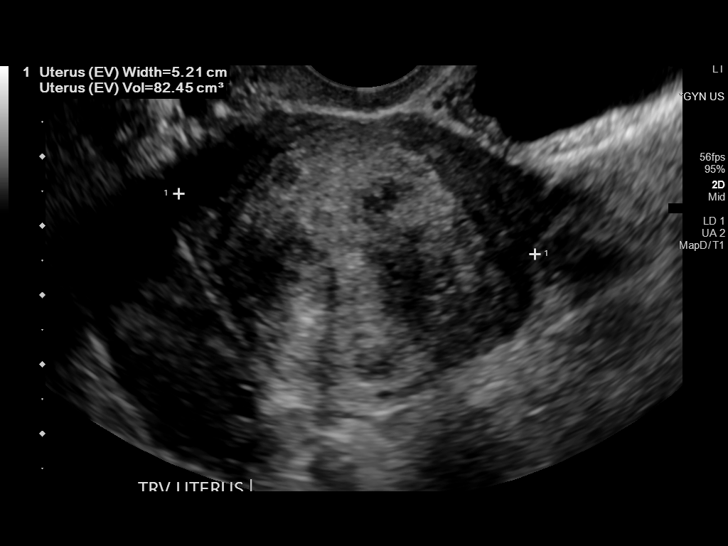

[13 of 25 positions shown; findings below may reference images not displayed]

FINDINGS: Uterus

Measurements: 7.8 x 3.9 x 5.2 cm = volume: 87 mL. Heterogeneous with
small fundal fibroids noted. At least 3 fundal fibroids are noted
all measuring 2.2 cm less in size.

Endometrium

Thickness: 2.7 mm.  No focal abnormality visualized.

Right ovary

Measurements: 2.8 x 2.1 x 2.9 cm = volume: 8.8 mL. Normal
appearance/no adnexal mass.

Left ovary

Measurements: 3.2 x 2.0 x 2.2 cm = volume: 7.3 mL. Normal
appearance/no adnexal mass.

Pulsed Doppler evaluation of both ovaries demonstrates normal
low-resistance arterial and venous waveforms.

Other findings

No abnormal free fluid.
IMPRESSION: Small fundal uterine fibroid measuring up to 2.2 cm.

No other acute finding by pelvic ultrasound or Doppler.

## 2023-02-15 ENCOUNTER — Telehealth: Payer: Self-pay

## 2023-02-15 NOTE — Telephone Encounter (Signed)
Mychart msg sent

## 2023-04-28 ENCOUNTER — Ambulatory Visit (INDEPENDENT_AMBULATORY_CARE_PROVIDER_SITE_OTHER): Payer: Medicaid Other | Admitting: Obstetrics & Gynecology

## 2023-04-28 ENCOUNTER — Telehealth: Payer: Self-pay

## 2023-04-28 ENCOUNTER — Encounter: Payer: Self-pay | Admitting: Obstetrics & Gynecology

## 2023-04-28 ENCOUNTER — Other Ambulatory Visit (HOSPITAL_COMMUNITY)
Admission: RE | Admit: 2023-04-28 | Discharge: 2023-04-28 | Disposition: A | Discharge status: Home / Self Care | Payer: Medicaid Other | Source: Ambulatory Visit | Attending: Obstetrics & Gynecology | Admitting: Obstetrics & Gynecology

## 2023-04-28 VITALS — BP 118/80 | HR 72 | Resp 16

## 2023-04-28 DIAGNOSIS — N898 Other specified noninflammatory disorders of vagina: Secondary | ICD-10-CM

## 2023-04-28 DIAGNOSIS — N760 Acute vaginitis: Secondary | ICD-10-CM | POA: Diagnosis not present

## 2023-04-28 DIAGNOSIS — N889 Noninflammatory disorder of cervix uteri, unspecified: Secondary | ICD-10-CM

## 2023-04-28 LAB — WET PREP FOR TRICH, YEAST, CLUE

## 2023-04-28 MED ORDER — NAPROXEN 500 MG PO TABS: 500.0000 mg | ORAL_TABLET | Freq: Two times a day (BID) | ORAL | 2 refills | Status: DC

## 2023-04-28 MED ORDER — TINIDAZOLE 500 MG PO TABS: 1000.0000 mg | ORAL_TABLET | Freq: Two times a day (BID) | ORAL | 0 refills | Status: AC

## 2023-04-28 NOTE — Progress Notes (Signed)
    Diana Butler October 22, 1981 [id number]        42 y.o.  G2P0020 S/P Bilateral Salpingectomy  RP: White vaginal discharge with spotting on & off  HPI: Pt c/o white discharge & spotting on & off x a week.  LMP 04/15/23 with normal flow.  Menses regular normal every month with dysmenorrhea, taking Naprosyn.  No pelvic pain otherwise.  Sexually active.  Will do STI screen.  Urine/BMs normal.  No fever.   OB History  Gravida Para Term Preterm AB Living  2       2 0  SAB IAB Ectopic Multiple Live Births      2        # Outcome Date GA Lbr Len/2nd Weight Sex Delivery Anes PTL Lv  2 Ectopic           1 Ectopic             Past medical history,surgical history, problem list, medications, allergies, family history and social history were all reviewed and documented in the EPIC chart.   Directed ROS with pertinent positives and negatives documented in the history of present illness/assessment and plan.  Exam:  Vitals:   04/28/23 0956  BP: 118/80  Pulse: 72  Resp: 16   General appearance:  Normal  Abdomen: Normal  Gynecologic exam: Vulva normal.  Speculum:  Cervix with white patches.  Increased vaginal discharge.  Wet prep done/SureSwab Advanced/Pap-HPV HR, Gono-Chlam.  Bimanual exam:  Uterus AV, normal volume, mobile, NT.  No adnexal mass, NT.  Wet prep:  Clue cells present with odor   Assessment/Plan:  42 y.o. G2P0020   1. Vaginal discharge Pt c/o white discharge & spotting on & off x a week.  LMP 04/15/23 with normal flow.  Menses regular normal every month with dysmenorrhea, taking Naprosyn.  No pelvic pain otherwise.  Sexually active.  Will do STI screen.  Urine/BMs normal.  No fever.  Wet prep confirming Bacterial Vaginosis.  Will treat with Tinidazole.  Usage reviewed, prescription sent to pharmacy.  SureSwab pending.  Gono-Chlam on Pap pending. - WET PREP FOR TRICH, YEAST, CLUE - SureSwab Advanced Vaginitis, TMA  2. Lesion of cervix White patches on the cervix.  Pap/HPV HR  done.  Schedule Colposcopy. - Colposcopy; Future - Cytology - PAP( [employer])  Other - naproxen (NAPROSYN) 500 MG tablet; Take 1 tablet (500 mg total) by mouth 2 (two) times daily with a meal. For Dysmenorrhea.  - tinidazole (TINDAMAX) 500 MG tablet; Take 2 tablets (1,000 mg total) by mouth 2 (two) times daily for 2 days.   Genia Del MD, 10:00 AM 04/28/2023

## 2023-04-28 NOTE — Telephone Encounter (Signed)
Tinidazole was sent in for patient & pharmacy stated that her insurance required prior authorization. I called patient & told her we could send in an alternative pt stated she would pay for it out of pocket. I sent her a goodrx rx & patient paid $25 for it.

## 2023-04-29 LAB — SURESWAB® ADVANCED VAGINITIS,TMA
CANDIDA SPECIES: NOT DETECTED
Candida glabrata: NOT DETECTED
SURESWAB(R) ADV BACTERIAL VAGINOSIS(BV),TMA: POSITIVE — AB
TRICHOMONAS VAGINALIS (TV),TMA: NOT DETECTED

## 2023-05-03 LAB — CYTOLOGY - PAP
Chlamydia: NEGATIVE
Comment: NEGATIVE
Comment: NEGATIVE
Comment: NORMAL
Diagnosis: NEGATIVE
Diagnosis: REACTIVE
High risk HPV: NEGATIVE
Neisseria Gonorrhea: NEGATIVE

## 2023-05-27 ENCOUNTER — Encounter: Payer: Self-pay | Admitting: Obstetrics & Gynecology

## 2023-05-27 ENCOUNTER — Other Ambulatory Visit (HOSPITAL_COMMUNITY)
Admission: RE | Admit: 2023-05-27 | Discharge: 2023-05-27 | Disposition: A | Discharge status: Home / Self Care | Payer: Medicaid Other | Source: Ambulatory Visit | Attending: Obstetrics & Gynecology | Admitting: Obstetrics & Gynecology

## 2023-05-27 ENCOUNTER — Ambulatory Visit (INDEPENDENT_AMBULATORY_CARE_PROVIDER_SITE_OTHER): Payer: Medicaid Other | Admitting: Obstetrics & Gynecology

## 2023-05-27 VITALS — BP 114/70 | HR 72

## 2023-05-27 DIAGNOSIS — N898 Other specified noninflammatory disorders of vagina: Secondary | ICD-10-CM | POA: Diagnosis not present

## 2023-05-27 DIAGNOSIS — N889 Noninflammatory disorder of cervix uteri, unspecified: Secondary | ICD-10-CM | POA: Diagnosis not present

## 2023-05-27 DIAGNOSIS — B3731 Acute candidiasis of vulva and vagina: Secondary | ICD-10-CM | POA: Diagnosis not present

## 2023-05-27 LAB — WET PREP FOR TRICH, YEAST, CLUE

## 2023-05-27 MED ORDER — FLUCONAZOLE 150 MG PO TABS: 150.0000 mg | ORAL_TABLET | ORAL | 3 refills | Status: AC

## 2023-05-27 NOTE — Progress Notes (Signed)
    Diana Butler 1981/07/21 [id number]        42 y.o.  [id number]   RP: White plaques on cervix for Colposcopy  HPI: White plaques visualized on cervix at gyn exam on 04/28/23.  Pap was Negative that day.  Patient was treated for BV.  Now c/o vaginal and vulvar irritation with a discharge.   OB History  Gravida Para Term Preterm AB Living  2       2 0  SAB IAB Ectopic Multiple Live Births      2        # Outcome Date GA Lbr Len/2nd Weight Sex Delivery Anes PTL Lv  2 Ectopic           1 Ectopic             Past medical history,surgical history, problem list, medications, allergies, family history and social history were all reviewed and documented in the EPIC chart.   Directed ROS with pertinent positives and negatives documented in the history of present illness/assessment and plan.  Exam:  Vitals:   05/27/23 1047  BP: 114/70  Pulse: 72  SpO2: 97%   General appearance:  Normal  Colposcopy Procedure Note Diana Butler 05/27/2023  Indications:  White plaques on the cervix  Procedure Details  The risks and benefits of the procedure and Written informed consent obtained.  Speculum placed in vagina and excellent visualization of cervix achieved, cervix swabbed x 3 with acetic acid solution.  Findings:  Cervix colposcopy: Physical Exam Genitourinary:        Vaginal colposcopy: Normal  Vulvar colposcopy: Normal  Perirectal colposcopy: Normal  The cervix was sprayed with Hurricane before performing the cervical biopsies.  Specimens: Wet prep. Cervical Biopsy at 9 O'Clock.    Complications:  No Cx, good hemostasis with Silver Nitrate. . Plan:  Management per results.  Wet Prep:  Yeasts present with Pseudohyphae   Assessment/Plan:  42 y.o. G2P0020   1. Lesion of cervix White plaques visualized on cervix at gyn exam on 04/28/23.  Pap was Negative that day.  Patient was treated for BV.  Now c/o vaginal and vulvar irritation with a discharge. White plaque  that looked like condylomas.  Colposcopy counseling done.  Findings reviewed with patient.  Post procedure precautions reviewed.  Cervical white plaque probably d/t very adherent white discharge.  Management per cervical Bx results.  Patient reassured. - Surgical pathology( [place]/ [facility])  2. Vaginal discharge Severe yeast vaginitis confirmed by wet prep.  Will treat with Fluconazole.  Usage reviewed, prescription sent to pharmacy. - WET PREP FOR TRICH, YEAST, CLUE  Other orders - fluconazole (DIFLUCAN) 150 MG tablet; Take 1 tablet (150 mg total) by mouth every other day for 3 doses. May repeat treatment as needed x 3.   Diana Del MD, 11:10 AM 05/27/2023

## 2023-06-01 LAB — SURGICAL PATHOLOGY

## 2023-11-26 ENCOUNTER — Ambulatory Visit
Admission: RE | Admit: 2023-11-26 | Discharge: 2023-11-26 | Disposition: A | Discharge status: Home / Self Care | Payer: Medicaid Other | Source: Ambulatory Visit | Attending: Family Medicine | Admitting: Family Medicine

## 2023-11-26 VITALS — BP 173/103 | HR 93 | Temp 99.4°F | Resp 16

## 2023-11-26 DIAGNOSIS — N898 Other specified noninflammatory disorders of vagina: Secondary | ICD-10-CM

## 2023-11-26 DIAGNOSIS — R590 Localized enlarged lymph nodes: Secondary | ICD-10-CM

## 2023-11-26 DIAGNOSIS — K644 Residual hemorrhoidal skin tags: Secondary | ICD-10-CM

## 2023-11-26 DIAGNOSIS — J018 Other acute sinusitis: Secondary | ICD-10-CM | POA: Diagnosis not present

## 2023-11-26 MED ORDER — IPRATROPIUM BROMIDE 0.03 % NA SOLN: 2.0000 | Freq: Two times a day (BID) | NASAL | 0 refills | Status: DC

## 2023-11-26 MED ORDER — CETIRIZINE HCL 10 MG PO TABS: 10.0000 mg | ORAL_TABLET | Freq: Every day | ORAL | 0 refills | Status: DC

## 2023-11-26 MED ORDER — HYDROCORTISONE (PERIANAL) 2.5 % EX CREA: 1.0000 | TOPICAL_CREAM | Freq: Two times a day (BID) | CUTANEOUS | 0 refills | Status: DC

## 2023-11-26 MED ORDER — PROMETHAZINE-DM 6.25-15 MG/5ML PO SYRP: 5.0000 mL | ORAL_SOLUTION | Freq: Three times a day (TID) | ORAL | 0 refills | Status: DC | PRN

## 2023-11-26 MED ORDER — AMOXICILLIN 875 MG PO TABS: 875.0000 mg | ORAL_TABLET | Freq: Two times a day (BID) | ORAL | 0 refills | Status: DC

## 2023-11-26 NOTE — ED Triage Notes (Signed)
Pt reports she has bumps in the groin area x 3-4 days; swelling in buttocks, itching and swelling in rectum x 2 weeks.

## 2023-11-26 NOTE — Discharge Instructions (Addendum)
We will wait to treat your urovaginal symptoms based off of the vaginal swab. Results will be available Tuesday of next week. We will manage this as a sinus infection with amoxicillin. For sore throat or cough try using a honey-based tea. Use 3 teaspoons of honey with juice squeezed from half lemon. Place shaved pieces of ginger into 1/2-1 cup of water and warm over stove top. Then mix the ingredients and repeat every 4 hours as needed. Please take Tylenol 500mg -650mg  every 6 hours for throat pain, fevers, aches and pains. Hydrate very well with at least 2 liters of water. Eat light meals such as soups (chicken and noodles, vegetable, chicken and wild rice).  Do not eat foods that you are allergic to.  Taking an antihistamine like Zyrtec can help against postnasal drainage, sinus congestion which can cause sinus pain, sinus headaches, throat pain, painful swallowing, coughing.  You can take this together with Atrovent nasal spray 2 times a day or twice daily as needed for the same kind of nasal drip, congestion.  Use cough medication to help slow down the cough and prevent further hemorrhoids. Use hydrocortisone cream to help with those.

## 2023-11-26 NOTE — ED Provider Notes (Signed)
[facility]  Note:  This document was prepared using Conservation officer, historic buildings and may include unintentional dictation errors.  MRN: [id number] DOB: [birth year]  Subjective:   Diana Butler is a 42 y.o. female presenting for 2-week history of acute onset persistent coughing, sinus drainage, sinus pain.  Her cough has been very bothersome and aggressive.  In the past 4 days she has noticed painful bumps over either side of her groin area.  Has also had vaginal discharge and irritation.  Has a history of BV and yeast infection.  Is not opposed to STI testing.  She has not felt really painful bumps in the buttock area as well.  No bloody stools.  No history of hemorrhoids.  No heavy lifting.  No current facility-administered medications for this encounter.  Current Outpatient Medications:    naproxen (NAPROSYN) 500 MG tablet, Take 1 tablet (500 mg total) by mouth 2 (two) times daily with a meal. For Dysmenorrhea., Disp: 20 tablet, Rfl: 2   Allergies  Allergen Reactions   Bee Venom     Swelling, hives, throat tightness    Past Medical History:  Diagnosis Date   Anemia    Endometriosis    per patient report.    Hypertension      Past Surgical History:  Procedure Laterality Date   DILITATION & CURRETTAGE/HYSTROSCOPY WITH HYDROTHERMAL ABLATION N/A 02/07/2020   Procedure: DILATATION & CURETTAGE/HYSTEROSCOPY WITH HYDROTHERMAL ABLATION AND  MYOSURE POLYPECTOMY;  Surgeon: Livingston Bing, MD;  Location: [facility];  Service: Gynecology;  Laterality: N/A;   ENDOMETRIAL BIOPSY  01/10/2020       SALPINGECTOMY     right, ectopic. pt states had l/s and ex-lap   SALPINGECTOMY     left, ectopic. pt states had l/s and ex-lap   TONSILLECTOMY     age 29    Family History  Problem Relation Age of Onset   Hypertension Mother     Social History   Tobacco Use   Smoking status: Every Day    Current packs/day: 0.00    Average packs/day: 0.5  packs/day for 12.0 years (6.0 ttl pk-yrs)    Types: Cigarettes    Start date: 01/30/2007    Last attempt to quit: 01/30/2019    Years since quitting: 4.8   Smokeless tobacco: Never  Vaping Use   Vaping status: Never Used  Substance Use Topics   Alcohol use: Yes    Comment: occ   Drug use: Yes    Types: Marijuana    ROS   Objective:   Vitals: BP (!) 173/103 (BP Location: Right Arm)   Pulse 93   Temp 99.4 F (37.4 C) (Oral)   Resp 16   LMP  (Within Weeks) Comment: 3 weeks  SpO2 97%   BP Readings from Last 3 Encounters:  11/26/23 (!) 173/103  05/27/23 114/70  04/28/23 118/80   Physical Exam Exam conducted with a chaperone present (CMA Mariela).  Constitutional:      General: She is not in acute distress.    Appearance: Normal appearance. She is well-developed and normal weight. She is not ill-appearing, toxic-appearing or diaphoretic.  HENT:     Head: Normocephalic and atraumatic.     Right Ear: Tympanic membrane, ear canal and external ear normal. No drainage or tenderness. No middle ear effusion. There is no impacted cerumen. Tympanic membrane is not erythematous or bulging.     Left Ear: Tympanic membrane, ear canal and external ear normal. No  drainage or tenderness.  No middle ear effusion. There is no impacted cerumen. Tympanic membrane is not erythematous or bulging.     Nose: Congestion present. No rhinorrhea.     Mouth/Throat:     Mouth: Mucous membranes are moist. No oral lesions.     Pharynx: Oropharynx is clear. No pharyngeal swelling, oropharyngeal exudate, posterior oropharyngeal erythema or uvula swelling.     Tonsils: No tonsillar exudate or tonsillar abscesses.     Comments: Postnasal drainage overlying pharynx. Eyes:     General: No scleral icterus.       Right eye: No discharge.        Left eye: No discharge.     Extraocular Movements: Extraocular movements intact.     Right eye: Normal extraocular motion.     Left eye: Normal extraocular motion.      Conjunctiva/sclera: Conjunctivae normal.  Cardiovascular:     Rate and Rhythm: Normal rate and regular rhythm.     Heart sounds: Normal heart sounds. No murmur heard.    No friction rub. No gallop.  Pulmonary:     Effort: Pulmonary effort is normal. No respiratory distress.     Breath sounds: No stridor. No wheezing, rhonchi or rales.  Chest:     Chest wall: No tenderness.  Abdominal:     General: Bowel sounds are normal. There is no distension.     Palpations: Abdomen is soft. There is no mass.     Tenderness: There is no abdominal tenderness. There is no right CVA tenderness, left CVA tenderness, guarding or rebound.  Genitourinary:   Musculoskeletal:     Cervical back: Normal range of motion and neck supple.  Lymphadenopathy:     Cervical: No cervical adenopathy.  Skin:    General: Skin is warm and dry.  Neurological:     General: No focal deficit present.     Mental Status: She is alert and oriented to person, place, and time.  Psychiatric:        Mood and Affect: Mood normal.        Behavior: Behavior normal.        Thought Content: Thought content normal.        Judgment: Judgment normal.     Assessment and Plan :   PDMP not reviewed this encounter.  1. Acute non-recurrent sinusitis of other sinus   2. External hemorrhoids   3. Inguinal lymphadenopathy   4. Vaginal discharge    Recommended managing for sinusitis with amoxicillin, supportive care.  I suspect that her aggressive cough is the likely source of her new hemorrhoids.  Recommended hydrocortisone rectal cream for this.  Otherwise, she has no difficulty with constipation.  Continue healthy well-balanced diet.  Monitor for blood pressure at home.  No history of hypertension, previous readings have been completely normal.  Vaginal cytology pending.  Patient declined empiric treatment.  Counseled patient on potential for adverse effects with medications prescribed/recommended today, ER and return-to-clinic  precautions discussed, patient verbalized understanding.    Wallis Bamberg, [place] 11/26/23 1203

## 2023-11-29 ENCOUNTER — Telehealth (HOSPITAL_COMMUNITY): Payer: Self-pay

## 2023-11-29 MED ORDER — FLUCONAZOLE 150 MG PO TABS: 150.0000 mg | ORAL_TABLET | Freq: Once | ORAL | 0 refills | Status: AC

## 2023-11-29 MED ORDER — METRONIDAZOLE 500 MG PO TABS: 500.0000 mg | ORAL_TABLET | Freq: Two times a day (BID) | ORAL | 0 refills | Status: AC

## 2023-11-29 NOTE — Telephone Encounter (Signed)
Per protocol, pt requires tx with metronidazole and Diflucan.  Rx sent to pharmacy on file.

## 2023-11-30 LAB — CERVICOVAGINAL ANCILLARY ONLY
Bacterial Vaginitis (gardnerella): POSITIVE — AB
Candida Glabrata: NEGATIVE
Candida Vaginitis: POSITIVE — AB
Chlamydia: NEGATIVE
Comment: NEGATIVE
Comment: NEGATIVE
Comment: NEGATIVE
Comment: NEGATIVE
Comment: NEGATIVE
Comment: NORMAL
Neisseria Gonorrhea: NEGATIVE
Trichomonas: NEGATIVE

## 2023-12-24 ENCOUNTER — Ambulatory Visit: Payer: Medicaid Other

## 2024-05-21 DIAGNOSIS — I10 Essential (primary) hypertension: Secondary | ICD-10-CM | POA: Diagnosis not present

## 2024-05-21 DIAGNOSIS — M79602 Pain in left arm: Secondary | ICD-10-CM | POA: Diagnosis not present

## 2024-06-20 ENCOUNTER — Telehealth: Payer: Self-pay | Admitting: Obstetrics and Gynecology

## 2024-06-20 NOTE — Telephone Encounter (Signed)
 This is Dr. Nikki doing chart recall for Dr. Lavoie.  Patient is due for annual exam and pap recall.   Please schedule with a provider.

## 2024-06-21 NOTE — Telephone Encounter (Signed)
 Call placed to patient, left detailed message, ok per dpr. Advised per Dr. Nikki. Return call to office at [phone] to schedule AEX, any additional questions or to advise if you have transferred care.

## 2024-06-23 DIAGNOSIS — S91001A Unspecified open wound, right ankle, initial encounter: Secondary | ICD-10-CM | POA: Diagnosis not present

## 2024-06-23 DIAGNOSIS — L03115 Cellulitis of right lower limb: Secondary | ICD-10-CM | POA: Diagnosis not present

## 2024-06-23 DIAGNOSIS — X58XXXA Exposure to other specified factors, initial encounter: Secondary | ICD-10-CM | POA: Diagnosis not present

## 2024-06-23 DIAGNOSIS — M25571 Pain in right ankle and joints of right foot: Secondary | ICD-10-CM | POA: Diagnosis not present

## 2024-06-27 DIAGNOSIS — Z114 Encounter for screening for human immunodeficiency virus [HIV]: Secondary | ICD-10-CM | POA: Diagnosis not present

## 2024-06-27 DIAGNOSIS — Z1329 Encounter for screening for other suspected endocrine disorder: Secondary | ICD-10-CM | POA: Diagnosis not present

## 2024-06-27 DIAGNOSIS — Z720 Tobacco use: Secondary | ICD-10-CM | POA: Diagnosis not present

## 2024-06-27 DIAGNOSIS — L301 Dyshidrosis [pompholyx]: Secondary | ICD-10-CM | POA: Diagnosis not present

## 2024-06-27 DIAGNOSIS — I10 Essential (primary) hypertension: Secondary | ICD-10-CM | POA: Diagnosis not present

## 2024-06-27 DIAGNOSIS — F17210 Nicotine dependence, cigarettes, uncomplicated: Secondary | ICD-10-CM | POA: Diagnosis not present

## 2024-06-27 DIAGNOSIS — Z0000 Encounter for general adult medical examination without abnormal findings: Secondary | ICD-10-CM | POA: Diagnosis not present

## 2024-06-27 DIAGNOSIS — Z23 Encounter for immunization: Secondary | ICD-10-CM | POA: Diagnosis not present

## 2024-06-27 DIAGNOSIS — Z13220 Encounter for screening for lipoid disorders: Secondary | ICD-10-CM | POA: Diagnosis not present

## 2024-06-27 DIAGNOSIS — Z1231 Encounter for screening mammogram for malignant neoplasm of breast: Secondary | ICD-10-CM | POA: Diagnosis not present

## 2024-06-27 DIAGNOSIS — F32A Depression, unspecified: Secondary | ICD-10-CM | POA: Diagnosis not present

## 2024-06-27 DIAGNOSIS — Z7689 Persons encountering health services in other specified circumstances: Secondary | ICD-10-CM | POA: Diagnosis not present

## 2024-07-07 NOTE — Telephone Encounter (Signed)
 No response from patient.   Dr. Nikki -please advise.

## 2024-07-07 NOTE — Telephone Encounter (Signed)
 Ok to send letter to patient recommending follow up and then remove from recall.

## 2024-07-08 ENCOUNTER — Ambulatory Visit (HOSPITAL_COMMUNITY)
Admission: RE | Admit: 2024-07-08 | Discharge: 2024-07-08 | Disposition: A | Discharge status: Home / Self Care | Source: Ambulatory Visit | Attending: Emergency Medicine | Admitting: Emergency Medicine

## 2024-07-08 ENCOUNTER — Encounter (HOSPITAL_COMMUNITY): Payer: Self-pay | Admitting: Emergency Medicine

## 2024-07-08 ENCOUNTER — Other Ambulatory Visit: Payer: Self-pay

## 2024-07-08 ENCOUNTER — Emergency Department (HOSPITAL_COMMUNITY)
Admission: EM | Admit: 2024-07-08 | Discharge: 2024-07-08 | Disposition: A | Discharge status: Home / Self Care | Attending: Emergency Medicine | Admitting: Emergency Medicine

## 2024-07-08 DIAGNOSIS — R2241 Localized swelling, mass and lump, right lower limb: Secondary | ICD-10-CM | POA: Diagnosis not present

## 2024-07-08 DIAGNOSIS — M79651 Pain in right thigh: Secondary | ICD-10-CM | POA: Diagnosis not present

## 2024-07-08 DIAGNOSIS — R300 Dysuria: Secondary | ICD-10-CM | POA: Insufficient documentation

## 2024-07-08 DIAGNOSIS — M7989 Other specified soft tissue disorders: Secondary | ICD-10-CM

## 2024-07-08 DIAGNOSIS — R110 Nausea: Secondary | ICD-10-CM | POA: Diagnosis not present

## 2024-07-08 LAB — URINALYSIS, W/ REFLEX TO CULTURE (INFECTION SUSPECTED)
Bacteria, UA: NONE SEEN
Bilirubin Urine: NEGATIVE
Glucose, UA: NEGATIVE mg/dL
Hgb urine dipstick: NEGATIVE
Ketones, ur: 20 mg/dL — AB
Leukocytes,Ua: NEGATIVE
Nitrite: NEGATIVE
Protein, ur: NEGATIVE mg/dL
Specific Gravity, Urine: 1.017 (ref 1.005–1.030)
pH: 5 (ref 5.0–8.0)

## 2024-07-08 MED ORDER — PHENAZOPYRIDINE HCL 200 MG PO TABS: 200.0000 mg | ORAL_TABLET | Freq: Three times a day (TID) | ORAL | 0 refills | Status: DC | PRN

## 2024-07-08 NOTE — ED Triage Notes (Signed)
 Patient c/o dysuria x 2 days. Patient report she was positive for UTI on her home test.  Patient denies abdominal pain. Patient report nausea, denies vomiting. Patient denies fever at home.

## 2024-07-08 NOTE — ED Provider Notes (Addendum)
 [place] EMERGENCY DEPARTMENT AT [facility] Provider Note   CSN: [id number] Arrival date & time: 07/08/24  0005     Patient presents with: Dysuria   Diana Butler is a 43 y.o. female.   The history is provided by the patient and medical records.  Dysuria  43 year old female presenting to the ED for possible UTI.  She reports she has had dysuria and frequency for the past 2 days.  Did a home test that was positive.  She denies any significant abdominal pain.  Some mild nausea but no vomiting.  Still able to eat and drink normally.  She denies any flank pain, fever, or chills.  Does not frequently get UTIs.  Denies any pelvic pain or vaginal complaints.  Prior to Admission medications   Medication Sig Start Date End Date Taking? Authorizing Provider  phenazopyridine  (PYRIDIUM ) 200 MG tablet Take 1 tablet (200 mg total) by mouth 3 (three) times daily as needed for pain. 07/08/24  Yes Jarold Olam HERO, PA-C  amLODipine (NORVASC) 10 MG tablet Take 10 mg by mouth daily. 06/27/24 06/27/25  [provider]  amoxicillin  (AMOXIL ) 875 MG tablet Take 1 tablet (875 mg total) by mouth 2 (two) times daily. Patient not taking: Reported on 07/08/2024 11/26/23   Christopher Savannah, PA-C  buPROPion (WELLBUTRIN XL) 150 MG 24 hr tablet Take 150 mg once daily for first 3 days, then 150 mg twice daily 06/27/24   [provider]  cetirizine  (ZYRTEC  ALLERGY) 10 MG tablet Take 1 tablet (10 mg total) by mouth daily. Patient not taking: Reported on 07/08/2024 11/26/23   Christopher Savannah, PA-C  hydrocortisone  (ANUSOL -HC) 2.5 % rectal cream Place 1 Application rectally 2 (two) times daily. Patient not taking: Reported on 07/08/2024 11/26/23   Christopher Savannah, PA-C  ipratropium (ATROVENT ) 0.03 % nasal spray Place 2 sprays into both nostrils 2 (two) times daily. Patient not taking: Reported on 07/08/2024 11/26/23   Christopher Savannah, PA-C  naproxen  (NAPROSYN ) 500 MG tablet Take 1 tablet (500 mg total) by mouth 2 (two)  times daily with a meal. For Dysmenorrhea. Patient not taking: Reported on 07/08/2024 04/28/23   Lavoie, Marie-Lyne, MD  promethazine -dextromethorphan (PROMETHAZINE -DM) 6.25-15 MG/5ML syrup Take 5 mLs by mouth 3 (three) times daily as needed for cough. Patient not taking: Reported on 07/08/2024 11/26/23   Christopher Savannah, PA-C  ferrous sulfate  325 (65 FE) MG tablet Take 1 tablet (325 mg total) by mouth 2 (two) times daily with a meal. TAKE WITH ORANGE JUICE Patient not taking: Reported on 10/18/2019 01/08/18 12/05/20  Street, [place], PA-C    Allergies: Bee venom    Review of Systems  Genitourinary:  Positive for dysuria and frequency.  All other systems reviewed and are negative.   Updated Vital Signs BP (!) 152/100 (BP Location: Right Arm)   Pulse 99   Temp 98.2 F (36.8 C) (Oral)   Resp 18   Ht 5' 1 (1.549 m)   Wt 71.7 kg   SpO2 100%   BMI 29.85 kg/m   Physical Exam Vitals and nursing note reviewed.  Constitutional:      Appearance: She is well-developed.  HENT:     Head: Normocephalic and atraumatic.  Eyes:     Conjunctiva/sclera: Conjunctivae normal.     Pupils: Pupils are equal, round, and reactive to light.  Cardiovascular:     Rate and Rhythm: Normal rate and regular rhythm.     Heart sounds: Normal heart sounds.  Pulmonary:     Effort:  Pulmonary effort is normal.     Breath sounds: Normal breath sounds.  Abdominal:     General: Bowel sounds are normal.     Palpations: Abdomen is soft.     Tenderness: There is no abdominal tenderness. There is no right CVA tenderness or left CVA tenderness.     Comments: Soft, nontender, no CVA tenderness  Musculoskeletal:        General: Normal range of motion.     Cervical back: Normal range of motion.  Skin:    General: Skin is warm and dry.  Neurological:     Mental Status: She is alert and oriented to person, place, and time.     (all labs ordered are listed, but only abnormal results are displayed) Labs Reviewed   URINALYSIS, W/ REFLEX TO CULTURE (INFECTION SUSPECTED) - Abnormal; Notable for the following components:      Result Value   Color, Urine STRAW (*)    Ketones, ur 20 (*)    All other components within normal limits    EKG: None  Radiology: No results found.   Procedures   Medications Ordered in the ED - No data to display                                  Medical Decision Making Risk Prescription drug management.   43 year old female here with dysuria and urinary frequency x 2 days.  She is afebrile and nontoxic in appearance here.  She has no abdominal tenderness, no CVA tenderness.  No pelvic pain or vaginal complaints.  UA here without signs of infection.  Did recently complete course of abx for different issue, may have already treated infection.  Will start on pyriudium to see if this helps.  Encouraged to follow-up with PCP if ongoing ssues.  Return here for new concerns.  4:25 AM At time of discharge, now also complaining of right thigh pain.  States it feels swollen and tight along mid thigh.  Denies fall or injury that she is aware of.  Remains ambulatory.  No prior hx of DVT.  Vascular US  not available at this hour, will order to be done in the AM at vascular lab.  If acute results, will be directed back to ER, otherwise will need to follow-up with PCP.  Final diagnoses:  Dysuria    ED Discharge Orders          Ordered    phenazopyridine  (PYRIDIUM ) 200 MG tablet  3 times daily PRN        07/08/24 0403               Jarold Olam HERO, PA-C 07/08/24 0420    Jarold Olam HERO, PA-C 07/08/24 9570    Jerral Meth, MD 07/08/24 [phone]

## 2024-07-08 NOTE — ED Notes (Signed)
Patient ambulated to bathroom and provided urine sample.  

## 2024-07-08 NOTE — Progress Notes (Signed)
 VASCULAR LAB    Right lower extremity venous duplex has been performed.  See CV proc for preliminary results.   Miki Labuda, RVT 07/08/2024, 12:04 PM

## 2024-07-08 NOTE — Discharge Instructions (Addendum)
 Urine did not show any signs of infection today.  Can try using the pyridium  to see if this help with the burning/urgency-- this will turn your urine bright orange/red in color which is normal. Follow-up with your doctor if ongoing issues. Return here for new concerns.

## 2024-07-10 NOTE — Telephone Encounter (Signed)
 Letter pended, copy routed to Dr. Nikki.

## 2024-07-10 NOTE — Telephone Encounter (Signed)
 Letter printed, copy to Dr. Nikki to be signed and mailed. Copy sent via MyChart.   Removed from recall.   Encounter closed.

## 2024-07-12 DIAGNOSIS — R300 Dysuria: Secondary | ICD-10-CM | POA: Diagnosis not present

## 2024-07-12 DIAGNOSIS — Z23 Encounter for immunization: Secondary | ICD-10-CM | POA: Diagnosis not present

## 2024-07-12 DIAGNOSIS — N3000 Acute cystitis without hematuria: Secondary | ICD-10-CM | POA: Diagnosis not present

## 2024-07-12 DIAGNOSIS — Z0000 Encounter for general adult medical examination without abnormal findings: Secondary | ICD-10-CM | POA: Diagnosis not present

## 2024-07-12 DIAGNOSIS — I10 Essential (primary) hypertension: Secondary | ICD-10-CM | POA: Diagnosis not present

## 2024-07-12 DIAGNOSIS — F32A Depression, unspecified: Secondary | ICD-10-CM | POA: Diagnosis not present

## 2024-07-26 DIAGNOSIS — N6325 Unspecified lump in the left breast, overlapping quadrants: Secondary | ICD-10-CM | POA: Diagnosis not present

## 2024-07-26 DIAGNOSIS — N6315 Unspecified lump in the right breast, overlapping quadrants: Secondary | ICD-10-CM | POA: Diagnosis not present

## 2024-07-26 DIAGNOSIS — N6323 Unspecified lump in the left breast, lower outer quadrant: Secondary | ICD-10-CM | POA: Diagnosis not present

## 2024-07-26 DIAGNOSIS — R92333 Mammographic heterogeneous density, bilateral breasts: Secondary | ICD-10-CM | POA: Diagnosis not present

## 2024-08-09 ENCOUNTER — Emergency Department (HOSPITAL_COMMUNITY)

## 2024-08-09 ENCOUNTER — Emergency Department (HOSPITAL_COMMUNITY)
Admission: EM | Admit: 2024-08-09 | Discharge: 2024-08-09 | Disposition: A | Discharge status: Home / Self Care | Attending: Emergency Medicine | Admitting: Emergency Medicine

## 2024-08-09 ENCOUNTER — Other Ambulatory Visit: Payer: Self-pay

## 2024-08-09 DIAGNOSIS — I10 Essential (primary) hypertension: Secondary | ICD-10-CM | POA: Diagnosis not present

## 2024-08-09 DIAGNOSIS — Z79899 Other long term (current) drug therapy: Secondary | ICD-10-CM | POA: Diagnosis not present

## 2024-08-09 DIAGNOSIS — R1084 Generalized abdominal pain: Secondary | ICD-10-CM | POA: Diagnosis not present

## 2024-08-09 DIAGNOSIS — R1031 Right lower quadrant pain: Secondary | ICD-10-CM | POA: Diagnosis not present

## 2024-08-09 DIAGNOSIS — D259 Leiomyoma of uterus, unspecified: Secondary | ICD-10-CM | POA: Diagnosis not present

## 2024-08-09 DIAGNOSIS — R1030 Lower abdominal pain, unspecified: Secondary | ICD-10-CM

## 2024-08-09 DIAGNOSIS — K3580 Unspecified acute appendicitis: Secondary | ICD-10-CM | POA: Diagnosis not present

## 2024-08-09 LAB — CBC
HCT: 41.8 % (ref 36.0–46.0)
Hemoglobin: 13.3 g/dL (ref 12.0–15.0)
MCH: 26.9 pg (ref 26.0–34.0)
MCHC: 31.8 g/dL (ref 30.0–36.0)
MCV: 84.6 fL (ref 80.0–100.0)
Platelets: 280 K/uL (ref 150–400)
RBC: 4.94 MIL/uL (ref 3.87–5.11)
RDW: 13.2 % (ref 11.5–15.5)
WBC: 8 K/uL (ref 4.0–10.5)
nRBC: 0 % (ref 0.0–0.2)

## 2024-08-09 LAB — COMPREHENSIVE METABOLIC PANEL WITH GFR
ALT: 14 U/L (ref 0–44)
AST: 19 U/L (ref 15–41)
Albumin: 3.9 g/dL (ref 3.5–5.0)
Alkaline Phosphatase: 57 U/L (ref 38–126)
Anion gap: 13 (ref 5–15)
BUN: 12 mg/dL (ref 6–20)
CO2: 23 mmol/L (ref 22–32)
Calcium: 9 mg/dL (ref 8.9–10.3)
Chloride: 103 mmol/L (ref 98–111)
Creatinine, Ser: 0.96 mg/dL (ref 0.44–1.00)
GFR, Estimated: >60 mL/min (ref >60)
Glucose, Bld: 100 mg/dL — ABNORMAL HIGH (ref 70–99)
Potassium: 3.4 mmol/L — ABNORMAL LOW (ref 3.5–5.1)
Sodium: 139 mmol/L (ref 135–145)
Total Bilirubin: 0.4 mg/dL (ref 0.0–1.2)
Total Protein: 6.9 g/dL (ref 6.5–8.1)

## 2024-08-09 LAB — URINALYSIS, ROUTINE W REFLEX MICROSCOPIC
Bilirubin Urine: NEGATIVE
Glucose, UA: NEGATIVE mg/dL
Ketones, ur: NEGATIVE mg/dL
Leukocytes,Ua: NEGATIVE
Nitrite: NEGATIVE
Protein, ur: NEGATIVE mg/dL
Specific Gravity, Urine: 1.021 (ref 1.005–1.030)
pH: 6 (ref 5.0–8.0)

## 2024-08-09 LAB — HCG, SERUM, QUALITATIVE: Preg, Serum: NEGATIVE

## 2024-08-09 LAB — LIPASE, BLOOD: Lipase: 50 U/L (ref 11–51)

## 2024-08-09 MED ORDER — LACTATED RINGERS IV BOLUS
1000.0000 mL | Freq: Once | INTRAVENOUS | Status: AC
  Administered 2024-08-09: 1000 mL via INTRAVENOUS

## 2024-08-09 MED ORDER — DICYCLOMINE HCL 20 MG PO TABS: 20.0000 mg | ORAL_TABLET | Freq: Two times a day (BID) | ORAL | 0 refills | Status: DC

## 2024-08-09 MED ORDER — MORPHINE SULFATE (PF) 4 MG/ML IV SOLN
4.0000 mg | Freq: Once | INTRAVENOUS | Status: AC
  Administered 2024-08-09: 4 mg via INTRAVENOUS
  Filled 2024-08-09: qty 1

## 2024-08-09 MED ORDER — IOHEXOL 350 MG/ML SOLN
75.0000 mL | Freq: Once | INTRAVENOUS | Status: AC | PRN
  Administered 2024-08-09: 75 mL via INTRAVENOUS

## 2024-08-09 MED ORDER — POLYETHYLENE GLYCOL 3350 17 GM/SCOOP PO POWD: 17.0000 g | Freq: Two times a day (BID) | ORAL | 0 refills | Status: AC

## 2024-08-09 MED ORDER — METOCLOPRAMIDE HCL 10 MG PO TABS: 10.0000 mg | ORAL_TABLET | Freq: Four times a day (QID) | ORAL | 0 refills | Status: DC

## 2024-08-09 MED ORDER — DIPHENHYDRAMINE HCL 25 MG PO CAPS
25.0000 mg | ORAL_CAPSULE | Freq: Once | ORAL | Status: AC
  Administered 2024-08-09: 25 mg via ORAL
  Filled 2024-08-09: qty 1

## 2024-08-09 MED ORDER — METOCLOPRAMIDE HCL 5 MG/ML IJ SOLN
10.0000 mg | Freq: Once | INTRAMUSCULAR | Status: AC
  Administered 2024-08-09: 10 mg via INTRAVENOUS
  Filled 2024-08-09: qty 2

## 2024-08-09 NOTE — ED Triage Notes (Signed)
 Pt. Stated, My last BM was Sunday with nausea

## 2024-08-09 NOTE — ED Provider Notes (Incomplete)
 [name] EMERGENCY DEPARTMENT AT [facility] Provider Note  CSN: [id number] Arrival date & time: 08/09/24 9181  Chief Complaint(s) Abdominal Pain, Nausea, and Constipation  HPI Diana Butler is a 43 y.o. female with past medical history as below, significant for *** who presents to the ED with complaint of ***  Past Medical History Past Medical History:  Diagnosis Date  . Anemia   . Endometriosis    per patient report.   . Hypertension    Patient Active Problem List   Diagnosis Date Noted  . 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 Submucous uterine fibroid 10/24/2019  . Abnormal uterine bleeding (AUB) 10/24/2019  . Anemia 02/21/2018  . Fibroid 07/22/2017  . Dysmenorrhea 07/22/2017  . Tobacco abuse 07/22/2017  . Depression 07/22/2017  . Menorrhagia with regular cycle 07/22/2017   Home Medication(s) Prior to Admission medications   Medication Sig Start Date End Date Taking? Authorizing Provider  amLODipine (NORVASC) 10 MG tablet Take 10 mg by mouth daily. 06/27/24 06/27/25  [provider]  amoxicillin  (AMOXIL ) 875 MG tablet Take 1 tablet (875 mg total) by mouth 2 (two) times daily. Patient not taking: Reported on 07/08/2024 11/26/23   Christopher Savannah, PA-C  buPROPion (WELLBUTRIN XL) 150 MG 24 hr tablet Take 150 mg once daily for first 3 days, then 150 mg twice daily 06/27/24   [provider]  cetirizine  (ZYRTEC  ALLERGY) 10 MG tablet Take 1 tablet (10 mg total) by mouth daily. Patient not taking: Reported on 07/08/2024 11/26/23   Christopher Savannah, PA-C  hydrocortisone  (ANUSOL -HC) 2.5 % rectal cream Place 1 Application rectally 2 (two) times daily. Patient not taking: Reported on 07/08/2024 11/26/23   Christopher Savannah, PA-C  ipratropium (ATROVENT ) 0.03 % nasal spray Place 2 sprays into both nostrils 2 (two) times daily. Patient not taking: Reported on 07/08/2024 11/26/23   Christopher Savannah, PA-C  naproxen  (NAPROSYN ) 500 MG tablet Take 1 tablet (500 mg total) by mouth 2 (two) times daily with a  meal. For Dysmenorrhea. Patient not taking: Reported on 07/08/2024 04/28/23   Lavoie, Marie-Lyne, MD  phenazopyridine  (PYRIDIUM ) 200 MG tablet Take 1 tablet (200 mg total) by mouth 3 (three) times daily as needed for pain. 07/08/24   Jarold Olam HERO, PA-C  promethazine -dextromethorphan (PROMETHAZINE -DM) 6.25-15 MG/5ML syrup Take 5 mLs by mouth 3 (three) times daily as needed for cough. Patient not taking: Reported on 07/08/2024 11/26/23   Christopher Savannah, PA-C  ferrous sulfate  325 (65 FE) MG tablet Take 1 tablet (325 mg total) by mouth 2 (two) times daily with a meal. TAKE WITH ORANGE JUICE Patient not taking: Reported on 10/18/2019 01/08/18 12/05/20  Street, [place], [place]                                                                                                                                    Past Surgical History Past Surgical History:  Procedure Laterality Date  . DILITATION & CURRETTAGE/HYSTROSCOPY WITH  HYDROTHERMAL ABLATION N/A 02/07/2020   Procedure: DILATATION & CURETTAGE/HYSTEROSCOPY WITH HYDROTHERMAL ABLATION AND  MYOSURE POLYPECTOMY;  Surgeon: Izell Harari, MD;  Location: [facility];  Service: Gynecology;  Laterality: N/A;  . ENDOMETRIAL BIOPSY  01/10/2020      . SALPINGECTOMY     right, ectopic. pt states had l/s and ex-lap  . SALPINGECTOMY     left, ectopic. pt states had l/s and ex-lap  . TONSILLECTOMY     age 11   Family History Family History  Problem Relation Age of Onset  . Hypertension Mother     Social History Social History   Tobacco Use  . Smoking status: Every Day    Current packs/day: 0.00    Average packs/day: 0.5 packs/day for 12.0 years (6.0 ttl pk-yrs)    Types: Cigarettes    Start date: 01/30/2007    Last attempt to quit: 01/30/2019    Years since quitting: 5.5  . Smokeless tobacco: Never  Vaping Use  . Vaping status: Never Used  Substance Use Topics  . Alcohol use: Yes    Comment: occ  . Drug use: Yes    Types: Marijuana    Allergies Bee venom  Review of Systems A thorough review of systems was obtained and all systems are negative except as noted in the HPI and PMH.   Physical Exam Vital Signs  I have reviewed the triage vital signs BP (!) 181/109 (BP Location: Left Arm)   Pulse 62   Temp 97.9 F (36.6 C) (Oral)   Resp 20   Ht 5' 1 (1.549 m)   Wt 70.8 kg   LMP 07/28/2024   SpO2 100%   BMI 29.48 kg/m  Physical Exam  ED Results and Treatments Labs (all labs ordered are listed, but only abnormal results are displayed) Labs Reviewed  LIPASE, BLOOD  COMPREHENSIVE METABOLIC PANEL WITH GFR  CBC  URINALYSIS, ROUTINE W REFLEX MICROSCOPIC  HCG, SERUM, QUALITATIVE                                                                                                                          Radiology No results found.  Pertinent labs & imaging results that were available during my care of the patient were reviewed by me and considered in my medical decision making (see MDM for details).  Medications Ordered in ED Medications - No data to display  Procedures Procedures  (including critical care time)  Medical Decision Making / ED Course    Medical Decision Making:    Saliha Salts is a 43 y.o. female ***. The complaint involves an extensive differential diagnosis and also carries with it a high risk of complications and morbidity.  Serious etiology was considered. Ddx includes but is not limited to: ***  Complete initial physical exam performed, notably the patient was in ***.    Reviewed and confirmed nursing documentation for past medical history, family history, social history.  Vital signs reviewed.    ***       ***               Additional history obtained: -Additional history obtained from {wsadditionalhistorian:28072} -External records  from outside source obtained and reviewed including: Chart review including previous notes, labs, imaging, consultation notes including  ***   Lab Tests: -I ordered, reviewed, and interpreted labs.   The pertinent results include:   Labs Reviewed  LIPASE, BLOOD  COMPREHENSIVE METABOLIC PANEL WITH GFR  CBC  URINALYSIS, ROUTINE W REFLEX MICROSCOPIC  HCG, SERUM, QUALITATIVE    Notable for ***  EKG   EKG Interpretation Date/Time:    Ventricular Rate:    PR Interval:    QRS Duration:    QT Interval:    QTC Calculation:   R Axis:      Text Interpretation:           Imaging Studies ordered: I ordered imaging studies including *** I independently visualized the following imaging with scope of interpretation limited to determining acute life threatening conditions related to emergency care; findings noted above I agree with the radiologist interpretation If any imaging was obtained with contrast I closely monitored patient for any possible adverse reaction a/w contrast administration in the emergency department   Medicines ordered and prescription drug management: No orders of the defined types were placed in this encounter.   -I have reviewed the patients home medicines and have made adjustments as needed   Consultations Obtained: I requested consultation with the ***,  and discussed lab and imaging findings as well as pertinent plan - they recommend: ***   Cardiac Monitoring: The patient was maintained on a cardiac monitor.  I personally viewed and interpreted the cardiac monitored which showed an underlying rhythm of: *** Continuous pulse oximetry interpreted by myself, ***% on ***.    Social Determinants of Health:  Diagnosis or treatment significantly limited by social determinants of health: {wssoc:28071}   Reevaluation: After the interventions noted above, I reevaluated the patient and found that they have {resolved/improved/worsened:23923::improved}  Co  morbidities that complicate the patient evaluation . Past Medical History:  Diagnosis Date  . Anemia   . Endometriosis    per patient report.   . Hypertension       Dispostion: Disposition decision including need for hospitalization was considered, and patient {wsdispo:28070::discharged from emergency department.}    Final Clinical Impression(s) / ED Diagnoses Final diagnoses:  None

## 2024-08-09 NOTE — ED Provider Notes (Signed)
 [place] [facility]ERGENC[facility] DEPARTMENT AT [facility] Provider Note   CSN: [id number] Arrival date & time: 08/09/24  9181     Patient presents with: Abdominal Pain, Nausea, and Constipation   Diana Butler is a 43 y.o. female.    Abdominal Pain Associated symptoms: constipation   Constipation Associated symptoms: abdominal pain    Patient is a 43 year old female with past medical history significant for endometriosis, hypertension, anemia history of tubal ligation and D&C but no other abdominal surgeries  She presents emergency room today with complaints of nausea and abdominal pain since Sunday she states she has progressively become more uncomfortable and had worsening right lower quadrant abdominal pain she states that it started umbilically and seemed to move to the right lower quadrant as time moved on.  She denies any vaginal bleeding or discharge.  She denies any urinary frequency urgency dysuria or hematuria.  She states that she did not have a bowel movement between Sunday and today and the stool that she produced today was hard.       Prior to Admission medications   Medication Sig Start Date End Date Taking? Authorizing Provider  dicyclomine  (BENTYL ) 20 MG tablet Take 1 tablet (20 mg total) by mouth 2 (two) times daily. 08/09/24  Yes Einar Nolasco S, PA  metoCLOPramide  (REGLAN ) 10 MG tablet Take 1 tablet (10 mg total) by mouth every 6 (six) hours. 08/09/24  Yes Leiyah Maultsby S, PA  polyethylene glycol powder (GLYCOLAX /MIRALAX ) 17 GM/SCOOP powder Take 17 g by mouth in the morning and at bedtime for 8 days. One scoop in beverage of your choice with each meal. You may increase if you continue to be constipated and decrease if your stool becomes too loose. 08/09/24 08/17/24 Yes Willliam Pettet S, PA  amLODipine (NORVASC) 10 MG tablet Take 10 mg by mouth daily. 06/27/24 06/27/25  [provider]  amoxicillin  (AMOXIL ) 875 MG tablet Take 1 tablet (875 mg total) by mouth 2  (two) times daily. Patient not taking: Reported on 07/08/2024 11/26/23   Christopher Savannah, PA-C  buPROPion (WELLBUTRIN XL) 150 MG 24 hr tablet Take 150 mg once daily for first 3 days, then 150 mg twice daily 06/27/24   [provider]  cetirizine  (ZYRTEC  ALLERGY) 10 MG tablet Take 1 tablet (10 mg total) by mouth daily. Patient not taking: Reported on 07/08/2024 11/26/23   Christopher Savannah, PA-C  hydrocortisone  (ANUSOL -HC) 2.5 % rectal cream Place 1 Application rectally 2 (two) times daily. Patient not taking: Reported on 07/08/2024 11/26/23   Christopher Savannah, PA-C  ipratropium (ATROVENT ) 0.03 % nasal spray Place 2 sprays into both nostrils 2 (two) times daily. Patient not taking: Reported on 07/08/2024 11/26/23   Christopher Savannah, PA-C  naproxen  (NAPROSYN ) 500 MG tablet Take 1 tablet (500 mg total) by mouth 2 (two) times daily with a meal. For Dysmenorrhea. Patient not taking: Reported on 07/08/2024 04/28/23   Lavoie, Marie-Lyne, MD  phenazopyridine  (PYRIDIUM ) 200 MG tablet Take 1 tablet (200 mg total) by mouth 3 (three) times daily as needed for pain. 07/08/24   Jarold Olam HERO, PA-C  promethazine -dextromethorphan (PROMETHAZINE -DM) 6.25-15 MG/5ML syrup Take 5 mLs by mouth 3 (three) times daily as needed for cough. Patient not taking: Reported on 07/08/2024 11/26/23   Christopher Savannah, PA-C  ferrous sulfate  325 (65 FE) MG tablet Take 1 tablet (325 mg total) by mouth 2 (two) times daily with a meal. TAKE WITH ORANGE JUICE Patient not taking: Reported on 10/18/2019 01/08/18 12/05/20  [address], [place], [place]  Allergies: Bee venom    Review of Systems  Gastrointestinal:  Positive for abdominal pain and constipation.    Updated Vital Signs BP 127/76 (BP Location: Left Arm)   Pulse 72   Temp 97.9 F (36.6 C) (Oral)   Resp 15   Ht 5' 1 (1.549 m)   Wt 70.8 kg   LMP 07/28/2024   SpO2 99%   BMI 29.48 kg/m   Physical Exam Vitals and nursing note reviewed.  Constitutional:      General: She is in acute distress.      Appearance: Normal appearance. She is not ill-appearing.  HENT:     Head: Normocephalic and atraumatic.     Nose: Nose normal.  Eyes:     General: No scleral icterus.       Right eye: No discharge.        Left eye: No discharge.     Conjunctiva/sclera: Conjunctivae normal.  Cardiovascular:     Rate and Rhythm: Normal rate and regular rhythm.     Pulses: Normal pulses.     Heart sounds: Normal heart sounds.  Pulmonary:     Effort: Pulmonary effort is normal. No respiratory distress.     Breath sounds: No stridor. No wheezing.  Abdominal:     Palpations: Abdomen is soft.     Tenderness: There is abdominal tenderness in the right lower quadrant and suprapubic area. Positive signs include obturator sign.  Musculoskeletal:     Cervical back: Normal range of motion.     Right lower leg: No edema.     Left lower leg: No edema.  Skin:    General: Skin is warm and dry.     Capillary Refill: Capillary refill takes less than 2 seconds.  Neurological:     Mental Status: She is alert and oriented to person, place, and time. Mental status is at baseline.  Psychiatric:        Mood and Affect: Mood normal.        Behavior: Behavior normal.     (all labs ordered are listed, but only abnormal results are displayed) Labs Reviewed  COMPREHENSIVE METABOLIC PANEL WITH GFR - Abnormal; Notable for the following components:      Result Value   Potassium 3.4 (*)    Glucose, Bld 100 (*)    All other components within normal limits  URINALYSIS, ROUTINE W REFLEX MICROSCOPIC - Abnormal; Notable for the following components:   APPearance CLOUDY (*)    Hgb urine dipstick SMALL (*)    Bacteria, UA RARE (*)    All other components within normal limits  LIPASE, BLOOD  CBC  HCG, SERUM, QUALITATIVE    EKG: None  Radiology: CT ABDOMEN PELVIS W CONTRAST Result Date: 08/09/2024 CLINICAL DATA:  Right lower quadrant pain. EXAM: CT ABDOMEN AND PELVIS WITH CONTRAST TECHNIQUE: Multidetector CT imaging of  the abdomen and pelvis was performed using the standard protocol following bolus administration of intravenous contrast. RADIATION DOSE REDUCTION: This exam was performed according to the departmental dose-optimization program which includes automated exposure control, adjustment of the mA and/or kV according to patient size and/or use of iterative reconstruction technique. CONTRAST:  75mL OMNIPAQUE  IOHEXOL  350 MG/ML SOLN COMPARISON:  12/05/2020. FINDINGS: Lower chest: No acute findings. Heart size normal. No pericardial effusion. No pleural effusion. Distal esophagus is grossly unremarkable. Hepatobiliary: Liver and gallbladder are unremarkable. No biliary ductal dilatation. Pancreas: Negative. Spleen: Negative. Adrenals/Urinary Tract: Adrenal glands and kidneys are unremarkable. Ureters are decompressed. Bladder is grossly  unremarkable. Stomach/Bowel: Stomach, small bowel, appendix and colon are unremarkable. Vascular/Lymphatic: Retroaortic left renal vein. Vascular structures are otherwise unremarkable. No pathologically enlarged lymph nodes. Reproductive: Uterus is visualized. Small fibroids. No adnexal mass. Other: No free fluid.  Mesenteries and peritoneum are unremarkable. Musculoskeletal: Minimal degenerative change in the spine. IMPRESSION: 1. No acute findings.  Specifically, no evidence of appendicitis. 2. Uterine fibroids. Electronically Signed   By: Newell Eke M.D.   On: 08/09/2024 10:48     Procedures   Medications Ordered in the ED  lactated ringers  bolus 1,000 mL (0 mLs Intravenous Stopped 08/09/24 1045)  morphine  (PF) 4 MG/ML injection 4 mg (4 mg Intravenous Given 08/09/24 0925)  metoCLOPramide  (REGLAN ) injection 10 mg (10 mg Intravenous Given 08/09/24 0926)  diphenhydrAMINE  (BENADRYL ) capsule 25 mg (25 mg Oral Given 08/09/24 0931)  iohexol  (OMNIPAQUE ) 350 MG/ML injection 75 mL (75 mLs Intravenous Contrast Given 08/09/24 1015)    Clinical Course as of 08/09/24 1332  Wed Aug 09, 2024  1129  IMPRESSION: 1. No acute findings.  Specifically, no evidence of appendicitis. 2. Uterine fibroids.   Electronically Signed   By: Newell Eke M.D.   [WF]    Clinical Course User Index [WF] Neldon Hamp RAMAN, [place]                                 Medical Decision Making Amount and/or Complexity of Data Reviewed Labs: ordered. Radiology: ordered.  Risk OTC drugs. Prescription drug management.   This patient presents to the ED for concern of abd pain, this involves a number of treatment options, and is a complaint that carries with it a moderate to high risk of complications and morbidity. A differential diagnosis was considered for the patient's symptoms which is discussed below:   The causes of generalized abdominal pain include but are not limited to AAA, mesenteric ischemia, appendicitis, diverticulitis, DKA, gastritis, gastroenteritis, AMI, nephrolithiasis, pancreatitis, peritonitis, adrenal insufficiency,lead poisoning, iron toxicity, intestinal ischemia, constipation, UTI,SBO/LBO, splenic rupture, biliary disease, IBD, IBS, PUD, or hepatitis. Ectopic pregnancy, ovarian torsion, PID.    Co morbidities: Discussed in HPI   Brief History:  Patient is a 43 year old female with past medical history significant for endometriosis, hypertension, anemia history of tubal ligation and D&C but no other abdominal surgeries  She presents emergency room today with complaints of nausea and abdominal pain since Sunday she states she has progressively become more uncomfortable and had worsening right lower quadrant abdominal pain she states that it started umbilically and seemed to move to the right lower quadrant as time moved on.  She denies any vaginal bleeding or discharge.  She denies any urinary frequency urgency dysuria or hematuria.  She states that she did not have a bowel movement between Sunday and today and the stool that she produced today was hard.    EMR reviewed including pt  PMHx, past surgical history and past visits to ER.   See HPI for more details   Lab Tests:   I personally reviewed all laboratory work and imaging. Metabolic panel without any acute abnormality specifically kidney function within normal limits and no significant electrolyte abnormalities. CBC without leukocytosis or significant anemia.   Imaging Studies:  NAD. I personally reviewed all imaging studies and no acute abnormality found. I agree with radiology interpretation.  No acute findings on CT abdomen pelvis with contrast.  I did call to discuss the CT findings with Dr. Eke of  radiology who does not see any stranding in the lower pelvis.  Or any other acute abnormal findings.  Cardiac Monitoring:  NA NA   Medicines ordered:  I ordered medication including lactated Ringer 's, morphine , Reglan , Benadryl  for pain Reevaluation of the patient after these medicines showed that the patient improved I have reviewed the patients home medicines and have made adjustments as needed   Critical Interventions:     Consults/Attending Physician   I requested consultation with radiology,  and discussed lab and imaging findings    Reevaluation:  After the interventions noted above I re-evaluated patient and found that they have :improved   Social Determinants of Health:      Problem List / ED Course:  Patient with lower abdominal pain and only 1 bowel movement the past week seems that this is abnormal for her and she normally has bowel movements more often.  Has reassuringly normal vital signs after pain control.  Was given 1 dose of morphine  was here in the emergency department for 4 hours and had no recurrence of her pain.  She is tolerating p.o., ambulatory and seems to feel much improved, urinalysis without evidence of infection no leukocytosis or anemia present, no evidence of GI bleed as evidenced by normal stools.  CT abdomen pelvis with contrast without abnormal  findings.  I recommend close outpatient follow-up with her primary care doctor.  Recommend bowel regimen with MiraLAX , Bentyl , Reglan  for nausea.   Dispostion:  After consideration of the diagnostic results and the patients response to treatment, I feel that the patent would benefit from outpatient follow-up.  Final diagnoses:  Lower abdominal pain    ED Discharge Orders          Ordered    polyethylene glycol powder (GLYCOLAX /MIRALAX ) 17 GM/SCOOP powder  2 times daily        08/09/24 1155    dicyclomine  (BENTYL ) 20 MG tablet  2 times daily        08/09/24 1155    metoCLOPramide  (REGLAN ) 10 MG tablet  Every 6 hours        08/09/24 1155               Neldon [place], [place] 08/09/24 1333    Pamella Ozell LABOR, DO 08/15/24 1512

## 2024-08-09 NOTE — ED Triage Notes (Signed)
 Pt sent from [facility] doc office for suspected appendicitis.  Pt has 10/10 abd pain since Sunday.

## 2024-08-09 NOTE — Discharge Instructions (Addendum)
 I am prescribing you MiraLAX  which is a fiber supplement.  Please take 1 scoop or 17 g of his powder with water or juice or other about fridge twice daily.  I recommend breakfast and dinner.  Make sure you are drinking plenty of water, follow-up with your primary care doctor.  Your CT scan did not show any evidence of appendicitis or any other emergent condition today.  However, keep a close eye in your symptoms.  I have prescribed you Reglan  for any nausea you experience and Bentyl  to help with any crampy abdominal pain.  If you develop a fever, burning with urination or any other new or concerning symptoms please return to the emergency room for reevaluation otherwise follow-up with your primary care doctor.

## 2024-08-09 NOTE — ED Notes (Signed)
 Patient transported to CT

## 2024-08-14 NOTE — Progress Notes (Unsigned)
 43 y.o. G40P0020 female with endometriosis, uterine fibroids, s/p endometrial ablation 02/2020, prior BTL (2/2 bilateral ectopic pregnancies) here for AUB and pelvic pain. Single.  Patient's last menstrual period was 07/28/2024.   2022- she was prescribed POP for ovulation pain. Has since discontinued. Cycles returned last year and the pain followed shortly after. She reports lower right side discomfort, passing blood clots X 1 week. Took naproxen  and ibuprofen .  Pelvic pain has always been right sided and associated with constipation Now taking a stool softener. Urine sample provided: No  Birth control: BTL Last mammogram: 07/26/24 Bi-Rads 3 Benign Sexually active: Yes  07/12/24 Gc/CT neg     Component Value Date/Time   DIAGPAP  04/28/2023 1026    - Negative for Intraepithelial Lesions or Malignancy (NILM)   DIAGPAP - Benign reactive/reparative changes 04/28/2023 1026   DIAGPAP  01/10/2020 0958    - Negative for intraepithelial lesion or malignancy (NILM)   HPVHIGH Negative 04/28/2023 1026   HPVHIGH Positive (A) 01/10/2020 0958   ADEQPAP  04/28/2023 1026    Satisfactory for evaluation; transformation zone component PRESENT.   ADEQPAP  01/10/2020 0958    Satisfactory for evaluation; transformation zone component ABSENT.   08/09/24 CT ab/pelvis: IMPRESSION: 1. No acute findings.  Specifically, no evidence of appendicitis. 2. Uterine fibroids.  GYN HISTORY: AS noted  OB History  Gravida Para Term Preterm AB Living  2    2 0  SAB IAB Ectopic Multiple Live Births    2      # Outcome Date GA Lbr Len/2nd Weight Sex Type Anes PTL Lv  2 Ectopic           1 Ectopic            Past Medical History:  Diagnosis Date   Anemia    Endometriosis    per patient report.    Hypertension    Past Surgical History:  Procedure Laterality Date   DILITATION & CURRETTAGE/HYSTROSCOPY WITH HYDROTHERMAL ABLATION N/A 02/07/2020   Procedure: DILATATION & CURETTAGE/HYSTEROSCOPY WITH HYDROTHERMAL  ABLATION AND  MYOSURE POLYPECTOMY;  Surgeon: Izell Harari, MD;  Location: [facility];  Service: Gynecology;  Laterality: N/A;   ENDOMETRIAL BIOPSY  01/10/2020       SALPINGECTOMY     right, ectopic. pt states had l/s and ex-lap   SALPINGECTOMY     left, ectopic. pt states had l/s and ex-lap   TONSILLECTOMY     age 43   Current Outpatient Medications on File Prior to Visit  Medication Sig Dispense Refill   amLODipine (NORVASC) 10 MG tablet Take 10 mg by mouth daily.     phenazopyridine  (PYRIDIUM ) 200 MG tablet Take 1 tablet (200 mg total) by mouth 3 (three) times daily as needed for pain. 9 tablet 0   polyethylene glycol powder (GLYCOLAX /MIRALAX ) 17 GM/SCOOP powder Take 17 g by mouth in the morning and at bedtime for 8 days. One scoop in beverage of your choice with each meal. You may increase if you continue to be constipated and decrease if your stool becomes too loose. 255 g 0   No current facility-administered medications on file prior to visit.   Allergies  Allergen Reactions   Bee Venom     Swelling, hives, throat tightness      PE Today's Vitals   08/15/24 0838  BP: 126/78  Pulse: 91  Temp: 98.4 F (36.9 C)  TempSrc: Oral  SpO2: 98%  Weight: 160 lb (72.6 kg)   Body  mass index is 30.23 kg/m.  Physical Exam Vitals reviewed. Exam conducted with a chaperone present.  Constitutional:      General: She is not in acute distress.    Appearance: Normal appearance.  HENT:     Head: Normocephalic and atraumatic.     Nose: Nose normal.  Eyes:     Extraocular Movements: Extraocular movements intact.     Conjunctiva/sclera: Conjunctivae normal.  Pulmonary:     Effort: Pulmonary effort is normal.  Abdominal:     Tenderness: There is abdominal tenderness in the right lower quadrant.      Comments: Minilap from second ectopic pregnancy  Genitourinary:    General: Normal vulva.     Exam position: Lithotomy position.     Vagina: Normal. No vaginal  discharge.     Cervix: Normal. No cervical motion tenderness, discharge or lesion.     Uterus: Normal. Not enlarged and not tender.      Adnexa: Right adnexa normal and left adnexa normal.     Comments: Mild cervical and right adnexal tenderness Musculoskeletal:        General: Normal range of motion.     Cervical back: Normal range of motion.  Neurological:     General: No focal deficit present.     Mental Status: She is alert.  Psychiatric:        Mood and Affect: Mood normal.        Behavior: Behavior normal.       Assessment and Plan:        Menorrhagia with regular cycle Submucous uterine fibroid Dysmenorrhea -     US  PELVIS TRANSVAGINAL NON-OB (TV ONLY); Future -     Endometrial biopsy; Future -     Norethindrone ; Take 1 tablet (0.35 mg total) by mouth daily.  Dispense: 28 tablet; Refill: 11 -     Naproxen ; Take 1 tablet (500 mg total) by mouth 2 (two) times daily with a meal for 5 days. Start 2 days before your cycle.  Dispense: 30 tablet; Refill: 3  Recommend restarting POP, continue naproxen . Could consider aygestin  given BTL RTO for TVUS+EMB Briefly discussed surgical management, however would proceed with hormonal therapy at this time pending w/u All questions answered  Vaginal discharge -     WET PREP FOR TRICH, YEAST, CLUE   Vera LULLA Pa, MD

## 2024-08-15 ENCOUNTER — Encounter: Payer: Self-pay | Admitting: Obstetrics and Gynecology

## 2024-08-15 ENCOUNTER — Ambulatory Visit: Admitting: Obstetrics and Gynecology

## 2024-08-15 VITALS — BP 126/78 | HR 91 | Temp 98.4°F | Wt 160.0 lb

## 2024-08-15 DIAGNOSIS — N920 Excessive and frequent menstruation with regular cycle: Secondary | ICD-10-CM | POA: Diagnosis not present

## 2024-08-15 DIAGNOSIS — D250 Submucous leiomyoma of uterus: Secondary | ICD-10-CM

## 2024-08-15 DIAGNOSIS — N898 Other specified noninflammatory disorders of vagina: Secondary | ICD-10-CM

## 2024-08-15 DIAGNOSIS — N946 Dysmenorrhea, unspecified: Secondary | ICD-10-CM

## 2024-08-15 LAB — WET PREP FOR TRICH, YEAST, CLUE

## 2024-08-15 MED ORDER — NORETHINDRONE 0.35 MG PO TABS
1.0000 | ORAL_TABLET | Freq: Every day | ORAL | 11 refills | Status: AC
Start: 2024-08-15 — End: ?

## 2024-08-15 MED ORDER — NAPROXEN 500 MG PO TABS: 500.0000 mg | ORAL_TABLET | Freq: Two times a day (BID) | ORAL | 3 refills | Status: AC

## 2024-08-16 ENCOUNTER — Ambulatory Visit: Payer: Self-pay | Admitting: Obstetrics and Gynecology

## 2024-08-16 DIAGNOSIS — B9689 Other specified bacterial agents as the cause of diseases classified elsewhere: Secondary | ICD-10-CM

## 2024-08-16 MED ORDER — METRONIDAZOLE 0.75 % VA GEL: 1.0000 | Freq: Every day | VAGINAL | 0 refills | Status: AC

## 2024-08-23 NOTE — Progress Notes (Incomplete)
 43 y.o. G85P0020 female here for annual exam. Single. PCP: Patti Mliss LABOR, FNP   Patient's last menstrual period was 07/28/2024.    She reports ***. Urine sample provided: ***  Abnormal bleeding: *** Pelvic discharge or pain: *** Breast mass, nipple discharge or skin changes : ***  Sexually active: *** Birth control: POP Last PAP:     Component Value Date/Time   DIAGPAP  04/28/2023 1026    - Negative for Intraepithelial Lesions or Malignancy (NILM)   DIAGPAP - Benign reactive/reparative changes 04/28/2023 1026   DIAGPAP  01/10/2020 0958    - Negative for intraepithelial lesion or malignancy (NILM)   HPVHIGH Negative 04/28/2023 1026   HPVHIGH Positive (A) 01/10/2020 0958   ADEQPAP  04/28/2023 1026    Satisfactory for evaluation; transformation zone component PRESENT.   ADEQPAP  01/10/2020 0958    Satisfactory for evaluation; transformation zone component ABSENT.   Last mammogram: never  Last colonoscopy: never   Exercising: *** Smoker: ***  Garment/textile technologist Visit from 01/10/2020 in [facility]  PHQ-2 Total Score 5    Flowsheet Row Office Visit from 01/10/2020 in [facility]  PHQ-9 Total Score 17     GYN HISTORY: ***  OB History  Gravida Para Term Preterm AB Living  2    2 0  SAB IAB Ectopic Multiple Live Births    2      # Outcome Date GA Lbr Len/2nd Weight Sex Type Anes PTL Lv  2 Ectopic           1 Ectopic            Past Medical History:  Diagnosis Date   Anemia    Endometriosis    per patient report.    Hypertension    Past Surgical History:  Procedure Laterality Date   DILITATION & CURRETTAGE/HYSTROSCOPY WITH HYDROTHERMAL ABLATION N/A 02/07/2020   Procedure: DILATATION & CURETTAGE/HYSTEROSCOPY WITH HYDROTHERMAL ABLATION AND  MYOSURE POLYPECTOMY;  Surgeon: Izell Harari, MD;  Location: [facility];  Service: Gynecology;  Laterality: N/A;   ENDOMETRIAL BIOPSY  01/10/2020        SALPINGECTOMY     right, ectopic. pt states had l/s and ex-lap   SALPINGECTOMY     left, ectopic. pt states had l/s and ex-lap   TONSILLECTOMY     age 19   Current Outpatient Medications on File Prior to Visit  Medication Sig Dispense Refill   amLODipine (NORVASC) 10 MG tablet Take 10 mg by mouth daily.     norethindrone  (MICRONOR ) 0.35 MG tablet Take 1 tablet (0.35 mg total) by mouth daily. 28 tablet 11   phenazopyridine  (PYRIDIUM ) 200 MG tablet Take 1 tablet (200 mg total) by mouth 3 (three) times daily as needed for pain. 9 tablet 0   No current facility-administered medications on file prior to visit.   Social History   Socioeconomic History   Marital status: Single    Spouse name: Not on file   Number of children: Not on file   Years of education: Not on file   Highest education level: Not on file  Occupational History   Not on file  Tobacco Use   Smoking status: Every Day    Current packs/day: 0.00    Average packs/day: 0.5 packs/day for 12.0 years (6.0 ttl pk-yrs)    Types: Cigarettes    Start date: 01/30/2007    Last attempt to quit: 01/30/2019    Years since  quitting: 5.5   Smokeless tobacco: Never  Vaping Use   Vaping status: Never Used  Substance and Sexual Activity   Alcohol use: Yes    Comment: occ   Drug use: Yes    Types: Marijuana   Sexual activity: Yes    Partners: Male    Birth control/protection: Surgical    Comment: bilateral salpingectomy  Other Topics Concern   Not on file  Social History Narrative   Not on file   Social Drivers of Health   Financial Resource Strain: Not on file  Food Insecurity: Low Risk  (07/19/2024)   Received from [facility]   Hunger Vital Sign    Within the past 12 months, you worried that your food would run out before you got money to buy more: Never true    Within the past 12 months, the food you bought just didn't last and you didn't have money to get more. : Never true  Transportation Needs: No  Transportation Needs (07/19/2024)   Received from [facility]    In the past 12 months, has lack of reliable transportation kept you from medical appointments, meetings, work or from getting things needed for daily living? : No  Physical Activity: Not on file  Stress: Not on file  Social Connections: Not on file  Intimate Partner Violence: Not on file   Family History  Problem Relation Age of Onset   Hypertension Mother    Allergies  Allergen Reactions   Bee Venom     Swelling, hives, throat tightness     PE There were no vitals filed for this visit. There is no height or weight on file to calculate BMI.  Physical Exam    Assessment and Plan:        There are no diagnoses linked to this encounter. Clotilda FORBES Pa, CMA

## 2024-08-24 ENCOUNTER — Ambulatory Visit: Admitting: Obstetrics and Gynecology

## 2024-08-25 DIAGNOSIS — I10 Essential (primary) hypertension: Secondary | ICD-10-CM | POA: Diagnosis not present

## 2024-08-25 DIAGNOSIS — K921 Melena: Secondary | ICD-10-CM | POA: Diagnosis not present

## 2024-09-07 ENCOUNTER — Ambulatory Visit

## 2024-09-07 ENCOUNTER — Ambulatory Visit (INDEPENDENT_AMBULATORY_CARE_PROVIDER_SITE_OTHER): Admitting: Obstetrics and Gynecology

## 2024-09-07 ENCOUNTER — Encounter: Payer: Self-pay | Admitting: Obstetrics and Gynecology

## 2024-09-07 VITALS — BP 132/74 | HR 81 | Temp 98.3°F | Wt 159.0 lb

## 2024-09-07 DIAGNOSIS — N9489 Other specified conditions associated with female genital organs and menstrual cycle: Secondary | ICD-10-CM | POA: Diagnosis not present

## 2024-09-07 DIAGNOSIS — N920 Excessive and frequent menstruation with regular cycle: Secondary | ICD-10-CM | POA: Diagnosis not present

## 2024-09-07 DIAGNOSIS — N946 Dysmenorrhea, unspecified: Secondary | ICD-10-CM

## 2024-09-07 DIAGNOSIS — Z01812 Encounter for preprocedural laboratory examination: Secondary | ICD-10-CM

## 2024-09-07 NOTE — Progress Notes (Signed)
 43 y.o. G38P0020 female with endometriosis, uterine fibroids, s/p endometrial ablation 02/2020, prior BTL (2/2 bilateral ectopic pregnancies) here for AUB and pelvic pain- TVUS. Single.  Patient's last menstrual period was 08/26/2024 (approximate).   2022- she was prescribed POP for ovulation pain. Has since discontinued. Cycles returned last year and the pain followed shortly after. She reports lower right side discomfort, passing blood clots X 1 week. Took naproxen  and ibuprofen .  Pelvic pain has always been right sided and associated with constipation Now taking a stool softener. Urine sample provided: No  Birth control: BTL Last mammogram: 07/26/24 Bi-Rads 3 Benign Sexually active: Yes  07/12/24 Gc/CT neg     Component Value Date/Time   DIAGPAP  04/28/2023 1026    - Negative for Intraepithelial Lesions or Malignancy (NILM)   DIAGPAP - Benign reactive/reparative changes 04/28/2023 1026   DIAGPAP  01/10/2020 0958    - Negative for intraepithelial lesion or malignancy (NILM)   HPVHIGH Negative 04/28/2023 1026   HPVHIGH Positive (A) 01/10/2020 0958   ADEQPAP  04/28/2023 1026    Satisfactory for evaluation; transformation zone component PRESENT.   ADEQPAP  01/10/2020 0958    Satisfactory for evaluation; transformation zone component ABSENT.   08/09/24 CT ab/pelvis: IMPRESSION: 1. No acute findings.  Specifically, no evidence of appendicitis. 2. Uterine fibroids.  GYN HISTORY: AS noted  OB History  Gravida Para Term Preterm AB Living  2    2 0  SAB IAB Ectopic Multiple Live Births    2      # Outcome Date GA Lbr Len/2nd Weight Sex Type Anes PTL Lv  2 Ectopic           1 Ectopic            Past Medical History:  Diagnosis Date   Anemia    Endometriosis    per patient report.    Hypertension    Past Surgical History:  Procedure Laterality Date   DILITATION & CURRETTAGE/HYSTROSCOPY WITH HYDROTHERMAL ABLATION N/A 02/07/2020   Procedure: DILATATION &  CURETTAGE/HYSTEROSCOPY WITH HYDROTHERMAL ABLATION AND  MYOSURE POLYPECTOMY;  Surgeon: Izell Harari, MD;  Location: [facility];  Service: Gynecology;  Laterality: N/A;   ENDOMETRIAL BIOPSY  01/10/2020       SALPINGECTOMY     right, ectopic. pt states had l/s and ex-lap   SALPINGECTOMY     left, ectopic. pt states had l/s and ex-lap   TONSILLECTOMY     age 61   Current Outpatient Medications on File Prior to Visit  Medication Sig Dispense Refill   amLODipine (NORVASC) 10 MG tablet Take 10 mg by mouth daily.     mupirocin ointment (BACTROBAN) 2 % Apply topically 3 (three) times daily.     norethindrone  (MICRONOR ) 0.35 MG tablet Take 1 tablet (0.35 mg total) by mouth daily. (Patient not taking: Reported on 09/07/2024) 28 tablet 11   phenazopyridine  (PYRIDIUM ) 200 MG tablet Take 1 tablet (200 mg total) by mouth 3 (three) times daily as needed for pain. 9 tablet 0   No current facility-administered medications on file prior to visit.   Allergies  Allergen Reactions   Bee Venom     Swelling, hives, throat tightness      PE Today's Vitals   09/07/24 1135  BP: 132/74  Pulse: 81  Temp: 98.3 F (36.8 C)  TempSrc: Oral  SpO2: 99%  Weight: 159 lb (72.1 kg)   Body mass index is 30.04 kg/m.  Physical Exam Vitals reviewed.  Constitutional:      General: She is not in acute distress.    Appearance: Normal appearance.  HENT:     Head: Normocephalic and atraumatic.     Nose: Nose normal.  Eyes:     Extraocular Movements: Extraocular movements intact.     Conjunctiva/sclera: Conjunctivae normal.  Pulmonary:     Effort: Pulmonary effort is normal.  Musculoskeletal:        General: Normal range of motion.     Cervical back: Normal range of motion.  Neurological:     General: No focal deficit present.     Mental Status: She is alert.  Psychiatric:        Mood and Affect: Mood normal.        Behavior: Behavior normal.      09/07/2024 TVUS: Indications:  AUB  Findings:   Uterus: 9.3x5.5x4.5cm, anteverted uterus. 8 intramural and subserosal fibroids noted, the largest measuring 1.9cm. Endometrial thickness: 7mm. 2 endometrial masses with feeder vessels, measuring 8mm and 6mm. Left ovary: 3.3 x 2.5 x 1.5 cm, normal appearing. Right ovary: 3.3x1.8x2.2cm, 20mm right ovarian follicle present. No free fluid.  Impression:  Small uterine fibroids, measuring <2cm. 2. Two endometrial masses noted, measuring 8mm and 6mm.  Vera LULLA Pa, MD   Assessment and Plan:        Menorrhagia with regular cycle -     Ambulatory Referral For Surgery Scheduling  Endometrial mass -     Ambulatory Referral For Surgery Scheduling  Dysmenorrhea  Plan for hysteroscopy, dilation and curettage, polypectomy.  Discussed outpatient procedure. Reviewed that  recovery is usually 1-2 days. Risks including infections, bleeding, and damage to surrounding organs reviewed. Recommend NPO prior to midnight and reviewed medication to take on day of surgery. Dicussed use of NSAIDS as needed for pain postoperatively.  Preop checklist: Antibiotics: none DVT ppx: SCDs Postop visit: 1 week Additional clearance: none     Vera LULLA Pa, MD

## 2024-09-27 NOTE — Progress Notes (Incomplete)
 43 y.o. G36P0020 female here for annual exam. Single. PCP: Patti Mliss LABOR, FNP   Patient's last menstrual period was 08/26/2024 (approximate).    She reports ***. Urine sample provided: ***  Abnormal bleeding: *** Pelvic discharge or pain: *** Breast mass, nipple discharge or skin changes : ***  Sexually active: *** Birth control: *** Last PAP:     Component Value Date/Time   DIAGPAP  04/28/2023 1026    - Negative for Intraepithelial Lesions or Malignancy (NILM)   DIAGPAP - Benign reactive/reparative changes 04/28/2023 1026   DIAGPAP  01/10/2020 0958    - Negative for intraepithelial lesion or malignancy (NILM)   HPVHIGH Negative 04/28/2023 1026   HPVHIGH Positive (A) 01/10/2020 0958   ADEQPAP  04/28/2023 1026    Satisfactory for evaluation; transformation zone component PRESENT.   ADEQPAP  01/10/2020 0958    Satisfactory for evaluation; transformation zone component ABSENT.   Last mammogram: *** *** Last colonoscopy: *** ***  Exercising: *** Smoker: ***  Flowsheet Row Office Visit from 01/10/2020 in [facility]  PHQ-2 Total Score 5    Flowsheet Row Office Visit from 01/10/2020 in [facility]  PHQ-9 Total Score 17     GYN HISTORY: ***  OB History  Gravida Para Term Preterm AB Living  2    2 0  SAB IAB Ectopic Multiple Live Births    2      # Outcome Date GA Lbr Len/2nd Weight Sex Type Anes PTL Lv  2 Ectopic           1 Ectopic            Past Medical History:  Diagnosis Date   Anemia    Endometriosis    per patient report.    Hypertension    Past Surgical History:  Procedure Laterality Date   DILITATION & CURRETTAGE/HYSTROSCOPY WITH HYDROTHERMAL ABLATION N/A 02/07/2020   Procedure: DILATATION & CURETTAGE/HYSTEROSCOPY WITH HYDROTHERMAL ABLATION AND  MYOSURE POLYPECTOMY;  Surgeon: Izell Harari, MD;  Location: [facility];  Service: Gynecology;  Laterality: N/A;   ENDOMETRIAL  BIOPSY  01/10/2020       SALPINGECTOMY     right, ectopic. pt states had l/s and ex-lap   SALPINGECTOMY     left, ectopic. pt states had l/s and ex-lap   TONSILLECTOMY     age 36   Current Outpatient Medications on File Prior to Visit  Medication Sig Dispense Refill   amLODipine (NORVASC) 10 MG tablet Take 10 mg by mouth daily.     mupirocin ointment (BACTROBAN) 2 % Apply topically 3 (three) times daily.     norethindrone  (MICRONOR ) 0.35 MG tablet Take 1 tablet (0.35 mg total) by mouth daily. (Patient not taking: Reported on 09/07/2024) 28 tablet 11   phenazopyridine  (PYRIDIUM ) 200 MG tablet Take 1 tablet (200 mg total) by mouth 3 (three) times daily as needed for pain. 9 tablet 0   No current facility-administered medications on file prior to visit.   Social History   Socioeconomic History   Marital status: Single    Spouse name: Not on file   Number of children: Not on file   Years of education: Not on file   Highest education level: Not on file  Occupational History   Not on file  Tobacco Use   Smoking status: Every Day    Current packs/day: 0.00    Average packs/day: 0.5 packs/day for 12.0 years (6.0 ttl pk-yrs)  Types: Cigarettes    Start date: 01/30/2007    Last attempt to quit: 01/30/2019    Years since quitting: 5.6   Smokeless tobacco: Never  Vaping Use   Vaping status: Never Used  Substance and Sexual Activity   Alcohol use: Yes    Comment: occ   Drug use: Yes    Types: Marijuana   Sexual activity: Yes    Partners: Male    Birth control/protection: Surgical    Comment: bilateral salpingectomy  Other Topics Concern   Not on file  Social History Narrative   Not on file   Social Drivers of Health   Financial Resource Strain: Not on file  Food Insecurity: Low Risk  (07/19/2024)   Received from [facility]   Hunger Vital Sign    Within the past 12 months, you worried that your food would run out before you got money to buy more: Never true    Within the  past 12 months, the food you bought just didn't last and you didn't have money to get more. : Never true  Transportation Needs: No Transportation Needs (07/19/2024)   Received from [facility]    In the past 12 months, has lack of reliable transportation kept you from medical appointments, meetings, work or from getting things needed for daily living? : No  Physical Activity: Not on file  Stress: Not on file  Social Connections: Not on file  Intimate Partner Violence: Not on file   Family History  Problem Relation Age of Onset   Hypertension Mother    Allergies  Allergen Reactions   Bee Venom     Swelling, hives, throat tightness     PE There were no vitals filed for this visit. There is no height or weight on file to calculate BMI.  Physical Exam    Assessment and Plan:        There are no diagnoses linked to this encounter. Clotilda FORBES Pa, CMA

## 2024-09-28 ENCOUNTER — Ambulatory Visit: Admitting: Obstetrics and Gynecology

## 2024-10-09 ENCOUNTER — Encounter: Payer: Self-pay | Admitting: Obstetrics and Gynecology

## 2024-10-09 ENCOUNTER — Encounter: Payer: Self-pay | Admitting: *Deleted

## 2024-10-09 ENCOUNTER — Ambulatory Visit: Admitting: Obstetrics and Gynecology

## 2024-10-09 ENCOUNTER — Other Ambulatory Visit (HOSPITAL_COMMUNITY)
Admission: RE | Admit: 2024-10-09 | Discharge: 2024-10-09 | Disposition: A | Discharge status: Home / Self Care | Source: Ambulatory Visit | Attending: Obstetrics and Gynecology | Admitting: Obstetrics and Gynecology

## 2024-10-09 ENCOUNTER — Ambulatory Visit: Payer: Self-pay | Admitting: Obstetrics and Gynecology

## 2024-10-09 VITALS — BP 130/80 | HR 94 | Temp 98.2°F | Ht 61.5 in | Wt 161.0 lb

## 2024-10-09 DIAGNOSIS — Z124 Encounter for screening for malignant neoplasm of cervix: Secondary | ICD-10-CM

## 2024-10-09 DIAGNOSIS — N889 Noninflammatory disorder of cervix uteri, unspecified: Secondary | ICD-10-CM | POA: Diagnosis present

## 2024-10-09 DIAGNOSIS — K610 Anal abscess: Secondary | ICD-10-CM

## 2024-10-09 DIAGNOSIS — Z01419 Encounter for gynecological examination (general) (routine) without abnormal findings: Secondary | ICD-10-CM | POA: Diagnosis not present

## 2024-10-09 DIAGNOSIS — B9689 Other specified bacterial agents as the cause of diseases classified elsewhere: Secondary | ICD-10-CM

## 2024-10-09 DIAGNOSIS — N9489 Other specified conditions associated with female genital organs and menstrual cycle: Secondary | ICD-10-CM | POA: Diagnosis not present

## 2024-10-09 DIAGNOSIS — Z1331 Encounter for screening for depression: Secondary | ICD-10-CM | POA: Diagnosis not present

## 2024-10-09 DIAGNOSIS — Z720 Tobacco use: Secondary | ICD-10-CM

## 2024-10-09 DIAGNOSIS — N920 Excessive and frequent menstruation with regular cycle: Secondary | ICD-10-CM | POA: Diagnosis not present

## 2024-10-09 DIAGNOSIS — N898 Other specified noninflammatory disorders of vagina: Secondary | ICD-10-CM

## 2024-10-09 LAB — WET PREP FOR TRICH, YEAST, CLUE

## 2024-10-09 MED ORDER — NICOTINE 7 MG/24HR TD PT24: MEDICATED_PATCH | TRANSDERMAL | 0 refills | Status: AC

## 2024-10-09 MED ORDER — METRONIDAZOLE 500 MG PO TABS: 500.0000 mg | ORAL_TABLET | Freq: Two times a day (BID) | ORAL | 0 refills | Status: AC

## 2024-10-09 MED ORDER — NICOTINE 14 MG/24HR TD PT24: MEDICATED_PATCH | TRANSDERMAL | 0 refills | Status: AC

## 2024-10-09 NOTE — Patient Instructions (Signed)

## 2024-10-09 NOTE — H&P (View-Only) (Signed)
 43 y.o. G50P0020 female with endometriosis, AUB, uterine fibroids, pelvic pain s/p endometrial ablation 02/2020, prior BTL (2/2 bilateral ectopic pregnancies) here for annual exam. Single, boyfriend. PCP: Patti Mliss LABOR, FNP   Patient's last menstrual period was 09/20/2024 (approximate). Period Duration (Days): 4 Period Pattern: (!) Irregular Menstrual Flow: Moderate Menstrual Control: Maxi pad Menstrual Control Change Freq (Hours): 4 Dysmenorrhea: (!) Severe Dysmenorrhea Symptoms: Cramping, Throbbing, Nausea, Diarrhea, Headache  She reports her cycles returned last year and the pain followed shortly after.  Pt has concerns regarding her hemorrhoids. Seen by PCP 1.5wk ago and started on sitz baths, prep H, hydrocortisone  cream. Symptoms are better but still having 4/10 pain despite using all med. Hemorrhoids started over the summer after she had a URI with persistent cough.  Pt would like help quitting smoking.  Quit 2 years ago cold turkey.  She has been trying but is having difficulty.  No fever, chest pain or shortness of breath. Boyfriend coming to procedure on 10/19/24.  Abnormal bleeding: none Pelvic discharge or pain: None Breast mass, nipple discharge or skin changes : None  Sexually active: Yes Birth control: BTL Last PAP:     Component Value Date/Time   DIAGPAP (A) 10/09/2024 1149    - Atypical squamous cells of undetermined significance (ASC-US )   DIAGPAP  04/28/2023 1026    - Negative for Intraepithelial Lesions or Malignancy (NILM)   DIAGPAP - Benign reactive/reparative changes 04/28/2023 1026   HPVHIGH Negative 10/09/2024 1149   HPVHIGH Negative 04/28/2023 1026   HPVHIGH Positive (A) 01/10/2020 0958   ADEQPAP  10/09/2024 1149    Satisfactory for evaluation; transformation zone component PRESENT.   ADEQPAP  04/28/2023 1026    Satisfactory for evaluation; transformation zone component PRESENT.   ADEQPAP  01/10/2020 0958    Satisfactory for evaluation;  transformation zone component ABSENT.   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    Hypertension    Past Surgical History:  Procedure Laterality Date   DILITATION & CURRETTAGE/HYSTROSCOPY WITH HYDROTHERMAL ABLATION N/A 02/07/2020   Procedure: DILATATION & CURETTAGE/HYSTEROSCOPY WITH HYDROTHERMAL ABLATION AND  MYOSURE POLYPECTOMY;  Surgeon: Izell Harari, MD;  Location: [facility];  Service: Gynecology;  Laterality: N/A;   ENDOMETRIAL BIOPSY  01/10/2020       SALPINGECTOMY     right, ectopic. pt states had l/s and ex-lap   SALPINGECTOMY     left, ectopic. pt states had l/s and ex-lap   TONSILLECTOMY     age 38   Current Outpatient Medications on File Prior to Visit  Medication Sig Dispense Refill   amLODipine (NORVASC) 10 MG tablet  Take 10 mg by mouth daily.     mupirocin ointment (BACTROBAN) 2 % Apply topically 3 (three) times daily.     cephALEXin (KEFLEX) 500 MG capsule Take 500 mg by mouth 3 (three) times daily.     Multiple Vitamin (MULTIVITAMIN PO) Take 4 each by mouth daily at 12 noon. gummy     norethindrone  (MICRONOR ) 0.35 MG tablet Take 1 tablet (0.35 mg total) by mouth daily. (Patient not taking: Reported on 09/07/2024) 28 tablet 11   No current facility-administered medications on file prior to visit.   Social  History   Socioeconomic History   Marital status: Single    Spouse name: Not on file   Number of children: Not on file   Years of education: Not on file   Highest education level: Not on file  Occupational History   Not on file  Tobacco Use   Smoking status: Every Day    Current packs/day: 0.00    Average packs/day: 0.5 packs/day for 12.0 years (6.0 ttl pk-yrs)    Types: Cigarettes    Start date: 01/30/2007    Last attempt to quit: 01/30/2019    Years since quitting: 5.7   Smokeless tobacco: Never  Vaping Use   Vaping status: Never Used  Substance and Sexual Activity   Alcohol use: Yes    Comment: occ   Drug use: Yes    Types: Marijuana    Comment: occ - smoke   Sexual activity: Yes    Partners: Male    Birth control/protection: Surgical    Comment: bilateral salpingectomy  Other Topics Concern   Not on file  Social History Narrative   Not on file   Social Drivers of Health   Financial Resource Strain: Not on file  Food Insecurity: Low Risk  (10/10/2024)   Received from [facility]   Hunger Vital Sign    Within the past 12 months, you worried that your food would run out before you got money to buy more: Never true    Within the past 12 months, the food you bought just didn't last and you didn't have money to get more. : Never true  Transportation Needs: No Transportation Needs (10/10/2024)   Received from [facility]    In the past 12 months, has lack of reliable transportation kept you from medical appointments, meetings, work or from getting things needed for daily living? : No  Physical Activity: Not on file  Stress: Not on file  Social Connections: Not on file  Intimate Partner Violence: Not on file   Family History  Problem Relation Age of Onset   Hypertension Mother    Allergies  Allergen Reactions   Bee Venom     Swelling, hives, throat tightness     PE Today's Vitals   10/09/24 1105  BP: 130/80  Pulse: 94  Temp: 98.2 F  (36.8 C)  TempSrc: Oral  SpO2: 99%  Weight: 161 lb (73 kg)  Height: 5' 1.5 (1.562 m)   Body mass index is 29.93 kg/m.  Physical Exam Vitals reviewed. Exam conducted with a chaperone present.  Constitutional:      General: She is not in acute distress.    Appearance: Normal appearance.  HENT:     Head: Normocephalic and atraumatic.     Nose: Nose normal.  Eyes:     Extraocular Movements: Extraocular movements intact.     Conjunctiva/sclera: Conjunctivae normal.  Neck:     Thyroid: No thyroid  mass, thyromegaly or thyroid tenderness.  Pulmonary:     Effort: Pulmonary effort is normal.  Chest:     Chest wall: No mass or tenderness.  Breasts:    Right: Normal. No swelling, mass, nipple discharge, skin change or tenderness.     Left: Normal. No swelling, mass, nipple discharge, skin change or tenderness.  Abdominal:     General: There is no distension.     Palpations: Abdomen is soft.     Tenderness: There is no abdominal tenderness.  Genitourinary:    General: Normal vulva.     Exam position: Lithotomy position.     Urethra: No prolapse.     Vagina: Normal. No vaginal discharge or bleeding.     Cervix: Normal. No lesion.     Uterus: Normal. Not enlarged and not tender.      Adnexa: Right adnexa normal and left adnexa normal.      Comments: ~1 cm perirectal abscess draining pus, +erythema, + TTP  Musculoskeletal:        General: Normal range of motion.     Cervical back: Normal range of motion.  Lymphadenopathy:     Upper Body:     Right upper body: No axillary adenopathy.     Left upper body: No axillary adenopathy.     Lower Body: No right inguinal adenopathy. No left inguinal adenopathy.  Skin:    General: Skin is warm and dry.  Neurological:     General: No focal deficit present.     Mental Status: She is alert.  Psychiatric:        Mood and Affect: Mood normal.        Behavior: Behavior normal.      Assessment and Plan:        Well woman exam with  routine gynecological exam Assessment & Plan: Cervical cancer screening performed according to ASCCP guidelines. Encouraged annual mammogram screening Colonoscopy scheduled  Labs and immunizations with her primary Encouraged safe sexual practices as indicated Encouraged healthy lifestyle practices with diet and exercise For patients under 50yo, I recommend 1000mg  calcium daily and 600IU of vitamin D daily.  Cervical cancer screening Lesion of cervix -     Cytology - PAP  Endometrial mass Menorrhagia with regular cycle Assessment & Plan: Plan for hysteroscopy, dilation and curettage, polypectomy scheduled for 10/19/2024.   Discussed outpatient procedure. Reviewed that  recovery is usually 1-2 days. Risks including infections, bleeding, and damage to surrounding organs reviewed. Recommend NPO prior to midnight and reviewed medication to take on day of surgery. Dicussed use of NSAIDS as needed for pain postoperatively.   Preop checklist: Antibiotics: none DVT ppx: SCDs Postop visit: 1 week Additional clearance: none  Discussed that this is the initial step is seeing if AUB and pain improves after endometrial masses are removed. Patient would like to wait to start p.o. P until after procedure is done.   Vaginal discharge -     WET PREP FOR TRICH, YEAST, CLUE  Tobacco abuse -     Nicotine; RX #1 Weeks 1-6: 14 mg x 1 patch daily. Wear for 24 hours. If you have sleep disturbances, remove at bedtime. (Patient not taking: Reported on 10/13/2024)  Dispense: 42 patch; Refill: 0 -     Nicotine; RX #2 Weeks 7-8: 7 mg x 1 patch dailyWear for 24 hours. If you have sleep disturbances, remove at bedtime.. (Patient not taking: Reported on 10/13/2024)  Dispense: 14 patch; Refill: 0  Quit 2 years ago cold  turkey.  She has been trying but is having difficulty. Discussed nicotine replacement: Wellbutrin, Chantix.  Patient has tried Wellbutrin in the past and did not like it.  She would like to try  nicotine replacement.  Perianal abscess  Recommend patient be evaluated at urgent care for possible antibiotics versus further imaging   Screening for depression  Vera LULLA Pa, MD

## 2024-10-09 NOTE — Progress Notes (Signed)
 43 y.o. G50P0020 female with endometriosis, AUB, uterine fibroids, pelvic pain s/p endometrial ablation 02/2020, prior BTL (2/2 bilateral ectopic pregnancies) here for annual exam. Single, boyfriend. PCP: Patti Mliss LABOR, FNP   Patient's last menstrual period was 09/20/2024 (approximate). Period Duration (Days): 4 Period Pattern: (!) Irregular Menstrual Flow: Moderate Menstrual Control: Maxi pad Menstrual Control Change Freq (Hours): 4 Dysmenorrhea: (!) Severe Dysmenorrhea Symptoms: Cramping, Throbbing, Nausea, Diarrhea, Headache  She reports her cycles returned last year and the pain followed shortly after.  Pt has concerns regarding her hemorrhoids. Seen by PCP 1.5wk ago and started on sitz baths, prep H, hydrocortisone  cream. Symptoms are better but still having 4/10 pain despite using all med. Hemorrhoids started over the summer after she had a URI with persistent cough.  Pt would like help quitting smoking.  Quit 2 years ago cold turkey.  She has been trying but is having difficulty.  No fever, chest pain or shortness of breath. Boyfriend coming to procedure on 10/19/24.  Abnormal bleeding: none Pelvic discharge or pain: None Breast mass, nipple discharge or skin changes : None  Sexually active: Yes Birth control: BTL Last PAP:     Component Value Date/Time   DIAGPAP (A) 10/09/2024 1149    - Atypical squamous cells of undetermined significance (ASC-US )   DIAGPAP  04/28/2023 1026    - Negative for Intraepithelial Lesions or Malignancy (NILM)   DIAGPAP - Benign reactive/reparative changes 04/28/2023 1026   HPVHIGH Negative 10/09/2024 1149   HPVHIGH Negative 04/28/2023 1026   HPVHIGH Positive (A) 01/10/2020 0958   ADEQPAP  10/09/2024 1149    Satisfactory for evaluation; transformation zone component PRESENT.   ADEQPAP  04/28/2023 1026    Satisfactory for evaluation; transformation zone component PRESENT.   ADEQPAP  01/10/2020 0958    Satisfactory for evaluation;  transformation zone component ABSENT.   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    Hypertension    Past Surgical History:  Procedure Laterality Date   DILITATION & CURRETTAGE/HYSTROSCOPY WITH HYDROTHERMAL ABLATION N/A 02/07/2020   Procedure: DILATATION & CURETTAGE/HYSTEROSCOPY WITH HYDROTHERMAL ABLATION AND  MYOSURE POLYPECTOMY;  Surgeon: Izell Harari, MD;  Location: [facility];  Service: Gynecology;  Laterality: N/A;   ENDOMETRIAL BIOPSY  01/10/2020       SALPINGECTOMY     right, ectopic. pt states had l/s and ex-lap   SALPINGECTOMY     left, ectopic. pt states had l/s and ex-lap   TONSILLECTOMY     age 38   Current Outpatient Medications on File Prior to Visit  Medication Sig Dispense Refill   amLODipine (NORVASC) 10 MG tablet  Take 10 mg by mouth daily.     mupirocin ointment (BACTROBAN) 2 % Apply topically 3 (three) times daily.     cephALEXin (KEFLEX) 500 MG capsule Take 500 mg by mouth 3 (three) times daily.     Multiple Vitamin (MULTIVITAMIN PO) Take 4 each by mouth daily at 12 noon. gummy     norethindrone  (MICRONOR ) 0.35 MG tablet Take 1 tablet (0.35 mg total) by mouth daily. (Patient not taking: Reported on 09/07/2024) 28 tablet 11   No current facility-administered medications on file prior to visit.   Social  History   Socioeconomic History   Marital status: Single    Spouse name: Not on file   Number of children: Not on file   Years of education: Not on file   Highest education level: Not on file  Occupational History   Not on file  Tobacco Use   Smoking status: Every Day    Current packs/day: 0.00    Average packs/day: 0.5 packs/day for 12.0 years (6.0 ttl pk-yrs)    Types: Cigarettes    Start date: 01/30/2007    Last attempt to quit: 01/30/2019    Years since quitting: 5.7   Smokeless tobacco: Never  Vaping Use   Vaping status: Never Used  Substance and Sexual Activity   Alcohol use: Yes    Comment: occ   Drug use: Yes    Types: Marijuana    Comment: occ - smoke   Sexual activity: Yes    Partners: Male    Birth control/protection: Surgical    Comment: bilateral salpingectomy  Other Topics Concern   Not on file  Social History Narrative   Not on file   Social Drivers of Health   Financial Resource Strain: Not on file  Food Insecurity: Low Risk  (10/10/2024)   Received from [facility]   Hunger Vital Sign    Within the past 12 months, you worried that your food would run out before you got money to buy more: Never true    Within the past 12 months, the food you bought just didn't last and you didn't have money to get more. : Never true  Transportation Needs: No Transportation Needs (10/10/2024)   Received from [facility]    In the past 12 months, has lack of reliable transportation kept you from medical appointments, meetings, work or from getting things needed for daily living? : No  Physical Activity: Not on file  Stress: Not on file  Social Connections: Not on file  Intimate Partner Violence: Not on file   Family History  Problem Relation Age of Onset   Hypertension Mother    Allergies  Allergen Reactions   Bee Venom     Swelling, hives, throat tightness     PE Today's Vitals   10/09/24 1105  BP: 130/80  Pulse: 94  Temp: 98.2 F  (36.8 C)  TempSrc: Oral  SpO2: 99%  Weight: 161 lb (73 kg)  Height: 5' 1.5 (1.562 m)   Body mass index is 29.93 kg/m.  Physical Exam Vitals reviewed. Exam conducted with a chaperone present.  Constitutional:      General: She is not in acute distress.    Appearance: Normal appearance.  HENT:     Head: Normocephalic and atraumatic.     Nose: Nose normal.  Eyes:     Extraocular Movements: Extraocular movements intact.     Conjunctiva/sclera: Conjunctivae normal.  Neck:     Thyroid: No thyroid  mass, thyromegaly or thyroid tenderness.  Pulmonary:     Effort: Pulmonary effort is normal.  Chest:     Chest wall: No mass or tenderness.  Breasts:    Right: Normal. No swelling, mass, nipple discharge, skin change or tenderness.     Left: Normal. No swelling, mass, nipple discharge, skin change or tenderness.  Abdominal:     General: There is no distension.     Palpations: Abdomen is soft.     Tenderness: There is no abdominal tenderness.  Genitourinary:    General: Normal vulva.     Exam position: Lithotomy position.     Urethra: No prolapse.     Vagina: Normal. No vaginal discharge or bleeding.     Cervix: Normal. No lesion.     Uterus: Normal. Not enlarged and not tender.      Adnexa: Right adnexa normal and left adnexa normal.      Comments: ~1 cm perirectal abscess draining pus, +erythema, + TTP  Musculoskeletal:        General: Normal range of motion.     Cervical back: Normal range of motion.  Lymphadenopathy:     Upper Body:     Right upper body: No axillary adenopathy.     Left upper body: No axillary adenopathy.     Lower Body: No right inguinal adenopathy. No left inguinal adenopathy.  Skin:    General: Skin is warm and dry.  Neurological:     General: No focal deficit present.     Mental Status: She is alert.  Psychiatric:        Mood and Affect: Mood normal.        Behavior: Behavior normal.      Assessment and Plan:        Well woman exam with  routine gynecological exam Assessment & Plan: Cervical cancer screening performed according to ASCCP guidelines. Encouraged annual mammogram screening Colonoscopy scheduled  Labs and immunizations with her primary Encouraged safe sexual practices as indicated Encouraged healthy lifestyle practices with diet and exercise For patients under 50yo, I recommend 1000mg  calcium daily and 600IU of vitamin D daily.  Cervical cancer screening Lesion of cervix -     Cytology - PAP  Endometrial mass Menorrhagia with regular cycle Assessment & Plan: Plan for hysteroscopy, dilation and curettage, polypectomy scheduled for 10/19/2024.   Discussed outpatient procedure. Reviewed that  recovery is usually 1-2 days. Risks including infections, bleeding, and damage to surrounding organs reviewed. Recommend NPO prior to midnight and reviewed medication to take on day of surgery. Dicussed use of NSAIDS as needed for pain postoperatively.   Preop checklist: Antibiotics: none DVT ppx: SCDs Postop visit: 1 week Additional clearance: none  Discussed that this is the initial step is seeing if AUB and pain improves after endometrial masses are removed. Patient would like to wait to start p.o. P until after procedure is done.   Vaginal discharge -     WET PREP FOR TRICH, YEAST, CLUE  Tobacco abuse -     Nicotine; RX #1 Weeks 1-6: 14 mg x 1 patch daily. Wear for 24 hours. If you have sleep disturbances, remove at bedtime. (Patient not taking: Reported on 10/13/2024)  Dispense: 42 patch; Refill: 0 -     Nicotine; RX #2 Weeks 7-8: 7 mg x 1 patch dailyWear for 24 hours. If you have sleep disturbances, remove at bedtime.. (Patient not taking: Reported on 10/13/2024)  Dispense: 14 patch; Refill: 0  Quit 2 years ago cold  turkey.  She has been trying but is having difficulty. Discussed nicotine replacement: Wellbutrin, Chantix.  Patient has tried Wellbutrin in the past and did not like it.  She would like to try  nicotine replacement.  Perianal abscess  Recommend patient be evaluated at urgent care for possible antibiotics versus further imaging   Screening for depression  Vera LULLA Pa, MD

## 2024-10-12 LAB — CYTOLOGY - PAP
Comment: NEGATIVE
Diagnosis: UNDETERMINED — AB
High risk HPV: NEGATIVE

## 2024-10-14 ENCOUNTER — Encounter: Payer: Self-pay | Admitting: Obstetrics and Gynecology

## 2024-10-16 ENCOUNTER — Telehealth: Payer: Self-pay

## 2024-10-16 MED ORDER — FLUCONAZOLE 150 MG PO TABS
150.0000 mg | ORAL_TABLET | Freq: Once | ORAL | 0 refills | Status: AC
Start: 2024-10-16 — End: 2024-10-16

## 2024-10-16 NOTE — Telephone Encounter (Signed)
 Patient is requesting diflucan  due to taking antibiotics.

## 2024-10-17 ENCOUNTER — Other Ambulatory Visit: Payer: Self-pay

## 2024-10-17 ENCOUNTER — Encounter (HOSPITAL_COMMUNITY): Payer: Self-pay | Admitting: Obstetrics and Gynecology

## 2024-10-17 NOTE — Progress Notes (Signed)
 SDW call  Patient was given pre-op instructions over the phone. Patient verbalized understanding of instructions provided.     PCP - Mliss Minors, FNP Cardiologist -  Pulmonary:    PPM/ICD - denies Device Orders - na Rep Notified - na   Chest x-ray - na EKG -  DOS, 10/19/2024 Stress Test - ECHO -  Cardiac Cath -   Sleep Study/sleep apnea/CPAP: denies  Non-diabetic  Blood Thinner Instructions: denies Aspirin Instructions:denies   ERAS Protcol - NPO   Anesthesia review: No   Patient denies shortness of breath, fever, cough and chest pain over the phone call  Your procedure is scheduled on Thursday October 19, 2024  Report to [facility] Main Entrance A at 1230 PM., then check in with the Admitting office.  Call this number if you have problems the morning of surgery:  [phone]   If you have any questions prior to your surgery date call [phone]: Open Monday-Friday 8am-4pm If you experience any cold or flu symptoms such as cough, fever, chills, shortness of breath, etc. between now and your scheduled surgery, please notify us  at the above number    Remember:  Do not eat or drink after midnight the night before your surgery  Take these medicines the morning of surgery with A SIP OF WATER:  Amlodipine, keflex  As of today, STOP taking any Aspirin (unless otherwise instructed by your surgeon) Aleve , Naproxen , Ibuprofen , Motrin , Advil , Goody's, BC's, all herbal medications, fish oil, and all vitamins.

## 2024-10-18 DIAGNOSIS — N9489 Other specified conditions associated with female genital organs and menstrual cycle: Secondary | ICD-10-CM | POA: Insufficient documentation

## 2024-10-18 NOTE — Assessment & Plan Note (Addendum)
 Plan for hysteroscopy, dilation and curettage, polypectomy scheduled for 10/19/2024.   Discussed outpatient procedure. Reviewed that  recovery is usually 1-2 days. Risks including infections, bleeding, and damage to surrounding organs reviewed. Recommend NPO prior to midnight and reviewed medication to take on day of surgery. Dicussed use of NSAIDS as needed for pain postoperatively.   Preop checklist: Antibiotics: none DVT ppx: SCDs Postop visit: 1 week Additional clearance: none  Discussed that this is the initial step is seeing if AUB and pain improves after endometrial masses are removed. Patient would like to wait to start p.o. P until after procedure is done.

## 2024-10-18 NOTE — Assessment & Plan Note (Signed)
 As above

## 2024-10-18 NOTE — Assessment & Plan Note (Signed)
 Cervical cancer screening performed according to ASCCP guidelines. Encouraged annual mammogram screening Colonoscopy scheduled Labs and immunizations with her primary Encouraged safe sexual practices as indicated Encouraged healthy lifestyle practices with diet and exercise For patients under 45yo, I recommend 1000mg  calcium daily and 600IU of vitamin D daily.

## 2024-10-19 ENCOUNTER — Encounter (HOSPITAL_COMMUNITY)
Admission: RE | Disposition: A | Discharge status: Home / Self Care | Payer: Self-pay | Source: Home / Self Care | Attending: Obstetrics and Gynecology

## 2024-10-19 ENCOUNTER — Ambulatory Visit (HOSPITAL_COMMUNITY)
Admission: RE | Admit: 2024-10-19 | Discharge: 2024-10-19 | Disposition: A | Discharge status: Home / Self Care | Attending: Obstetrics and Gynecology | Admitting: Obstetrics and Gynecology

## 2024-10-19 ENCOUNTER — Ambulatory Visit (HOSPITAL_COMMUNITY): Admitting: Anesthesiology

## 2024-10-19 ENCOUNTER — Encounter (HOSPITAL_COMMUNITY): Payer: Self-pay | Admitting: Obstetrics and Gynecology

## 2024-10-19 DIAGNOSIS — N898 Other specified noninflammatory disorders of vagina: Secondary | ICD-10-CM | POA: Diagnosis not present

## 2024-10-19 DIAGNOSIS — N920 Excessive and frequent menstruation with regular cycle: Secondary | ICD-10-CM

## 2024-10-19 DIAGNOSIS — N9489 Other specified conditions associated with female genital organs and menstrual cycle: Secondary | ICD-10-CM | POA: Diagnosis not present

## 2024-10-19 DIAGNOSIS — F17210 Nicotine dependence, cigarettes, uncomplicated: Secondary | ICD-10-CM | POA: Diagnosis not present

## 2024-10-19 DIAGNOSIS — N858 Other specified noninflammatory disorders of uterus: Secondary | ICD-10-CM | POA: Insufficient documentation

## 2024-10-19 DIAGNOSIS — I10 Essential (primary) hypertension: Secondary | ICD-10-CM | POA: Insufficient documentation

## 2024-10-19 DIAGNOSIS — K610 Anal abscess: Secondary | ICD-10-CM | POA: Diagnosis not present

## 2024-10-19 HISTORY — PX: DILATATION & CURRETTAGE/HYSTEROSCOPY WITH RESECTOCOPE: SHX5572

## 2024-10-19 HISTORY — PX: MYOSURE RESECTION: SHX7611

## 2024-10-19 LAB — POCT PREGNANCY, URINE: Preg Test, Ur: NEGATIVE

## 2024-10-19 LAB — CBC
HCT: 41.3 % (ref 36.0–46.0)
Hemoglobin: 13.4 g/dL (ref 12.0–15.0)
MCH: 27.3 pg (ref 26.0–34.0)
MCHC: 32.4 g/dL (ref 30.0–36.0)
MCV: 84.3 fL (ref 80.0–100.0)
Platelets: 301 K/uL (ref 150–400)
RBC: 4.9 MIL/uL (ref 3.87–5.11)
RDW: 14 % (ref 11.5–15.5)
WBC: 7.2 K/uL (ref 4.0–10.5)
nRBC: 0 % (ref 0.0–0.2)

## 2024-10-19 SURGERY — DILATATION & CURETTAGE/HYSTEROSCOPY WITH RESECTOCOPE
Anesthesia: General

## 2024-10-19 MED ORDER — LIDOCAINE HCL (PF) 1 % IJ SOLN
INTRAMUSCULAR | Status: DC | PRN
  Administered 2024-10-19: 10 mL

## 2024-10-19 MED ORDER — KETOROLAC TROMETHAMINE 30 MG/ML IJ SOLN
INTRAMUSCULAR | Status: AC
  Filled 2024-10-19: qty 1

## 2024-10-19 MED ORDER — OXYCODONE HCL 5 MG PO TABS: 5.0000 mg | ORAL_TABLET | Freq: Once | ORAL | Status: DC | PRN

## 2024-10-19 MED ORDER — DEXAMETHASONE SOD PHOSPHATE PF 10 MG/ML IJ SOLN
INTRAMUSCULAR | Status: DC | PRN
Start: 2024-10-19 — End: 2024-10-19
  Administered 2024-10-19: 8 mg via INTRAVENOUS

## 2024-10-19 MED ORDER — FENTANYL CITRATE (PF) 100 MCG/2ML IJ SOLN
INTRAMUSCULAR | Status: AC
  Filled 2024-10-19: qty 2

## 2024-10-19 MED ORDER — LACTATED RINGERS IV SOLN: INTRAVENOUS | Status: DC

## 2024-10-19 MED ORDER — ONDANSETRON HCL 4 MG/2ML IJ SOLN: 4.0000 mg | Freq: Four times a day (QID) | INTRAMUSCULAR | Status: DC | PRN

## 2024-10-19 MED ORDER — ACETAMINOPHEN 500 MG PO TABS
1000.0000 mg | ORAL_TABLET | ORAL | Status: DC
  Filled 2024-10-19: qty 2

## 2024-10-19 MED ORDER — FENTANYL CITRATE (PF) 100 MCG/2ML IJ SOLN
25.0000 ug | INTRAMUSCULAR | Status: DC | PRN
  Administered 2024-10-19: 50 ug via INTRAVENOUS

## 2024-10-19 MED ORDER — KETOROLAC TROMETHAMINE 30 MG/ML IJ SOLN
INTRAMUSCULAR | Status: DC | PRN
  Administered 2024-10-19: 15 mg via INTRAVENOUS

## 2024-10-19 MED ORDER — KETOROLAC TROMETHAMINE 30 MG/ML IJ SOLN
INTRAMUSCULAR | Status: AC
Start: 2024-10-19 — End: 2024-10-19
  Filled 2024-10-19: qty 2

## 2024-10-19 MED ORDER — OXYCODONE HCL 5 MG/5ML PO SOLN: 5.0000 mg | Freq: Once | ORAL | Status: DC | PRN

## 2024-10-19 MED ORDER — ORAL CARE MOUTH RINSE: 15.0000 mL | Freq: Once | OROMUCOSAL | Status: AC

## 2024-10-19 MED ORDER — SODIUM CHLORIDE 0.9 % IR SOLN
Status: DC | PRN
  Administered 2024-10-19: 3000 mL

## 2024-10-19 MED ORDER — LIDOCAINE 2% (20 MG/ML) 5 ML SYRINGE
INTRAMUSCULAR | Status: AC
  Filled 2024-10-19: qty 5

## 2024-10-19 MED ORDER — PROPOFOL 10 MG/ML IV BOLUS
INTRAVENOUS | Status: DC | PRN
  Administered 2024-10-19: 180 mg via INTRAVENOUS
  Administered 2024-10-19: 50 ug/kg/min via INTRAVENOUS

## 2024-10-19 MED ORDER — CELECOXIB 200 MG PO CAPS
400.0000 mg | ORAL_CAPSULE | ORAL | Status: DC
  Filled 2024-10-19: qty 2

## 2024-10-19 MED ORDER — ONDANSETRON HCL 4 MG/2ML IJ SOLN
INTRAMUSCULAR | Status: AC
  Filled 2024-10-19: qty 2

## 2024-10-19 MED ORDER — CHLORHEXIDINE GLUCONATE 0.12 % MT SOLN
15.0000 mL | Freq: Once | OROMUCOSAL | Status: AC
  Administered 2024-10-19: 15 mL via OROMUCOSAL
  Filled 2024-10-19: qty 15

## 2024-10-19 MED ORDER — FENTANYL CITRATE (PF) 250 MCG/5ML IJ SOLN
INTRAMUSCULAR | Status: DC | PRN
  Administered 2024-10-19 (×2): 50 ug via INTRAVENOUS

## 2024-10-19 MED ORDER — ONDANSETRON HCL 4 MG/2ML IJ SOLN
INTRAMUSCULAR | Status: DC | PRN
  Administered 2024-10-19: 4 mg via INTRAVENOUS

## 2024-10-19 MED ORDER — LIDOCAINE HCL 1 % IJ SOLN
INTRAMUSCULAR | Status: AC
Start: 2024-10-19 — End: 2024-10-19
  Filled 2024-10-19: qty 20

## 2024-10-19 MED ORDER — LIDOCAINE 2% (20 MG/ML) 5 ML SYRINGE
INTRAMUSCULAR | Status: DC | PRN
Start: 2024-10-19 — End: 2024-10-19
  Administered 2024-10-19: 40 mg via INTRAVENOUS

## 2024-10-19 MED ORDER — MIDAZOLAM HCL (PF) 2 MG/2ML IJ SOLN
INTRAMUSCULAR | Status: DC | PRN
Start: 2024-10-19 — End: 2024-10-19
  Administered 2024-10-19: 2 mg via INTRAVENOUS

## 2024-10-19 MED ORDER — MIDAZOLAM HCL 2 MG/2ML IJ SOLN
INTRAMUSCULAR | Status: AC
  Filled 2024-10-19: qty 2

## 2024-10-19 MED ORDER — FENTANYL CITRATE (PF) 100 MCG/2ML IJ SOLN: 25.0000 ug | INTRAMUSCULAR | Status: DC | PRN

## 2024-10-19 MED ORDER — PROPOFOL 10 MG/ML IV BOLUS
INTRAVENOUS | Status: AC
  Filled 2024-10-19: qty 20

## 2024-10-19 SURGICAL SUPPLY — 15 items
DEVICE MYOSURE LITE (MISCELLANEOUS) IMPLANT
DEVICE MYOSURE REACH (MISCELLANEOUS) IMPLANT
DILATOR CANAL MILEX (MISCELLANEOUS) IMPLANT
GLOVE BIO SURGEON STRL SZ7 (GLOVE) ×2 IMPLANT
GLOVE BIOGEL PI IND STRL 7.0 (GLOVE) ×2 IMPLANT
GOWN STRL REUS W/ TWL XL LVL3 (GOWN DISPOSABLE) ×2 IMPLANT
HIBICLENS CHG 4% 4OZ BTL (MISCELLANEOUS) ×2 IMPLANT
KIT PROCED FLUENT PRO FLT212S (KITS) ×2 IMPLANT
KIT TURNOVER KIT B (KITS) ×2 IMPLANT
PACK VAGINAL MINOR WOMEN LF (CUSTOM PROCEDURE TRAY) ×2 IMPLANT
PAD OB MATERNITY 11 LF (PERSONAL CARE ITEMS) ×2 IMPLANT
SEAL ROD LENS SCOPE MYOSURE (ABLATOR) ×2 IMPLANT
SOL .9 NS 3000ML IRR UROMATIC (IV SOLUTION) ×2 IMPLANT
SYSTEM TISS REMOVAL MYOSURE XL (MISCELLANEOUS) IMPLANT
TOWEL OR 17X24 6PK STRL BLUE (TOWEL DISPOSABLE) ×2 IMPLANT

## 2024-10-19 NOTE — Anesthesia Procedure Notes (Signed)
 Procedure Name: LMA Insertion Date/Time: 10/19/2024 2:24 PM  Performed by: Delores Duwaine SAUNDERS, CRNAPre-anesthesia Checklist: Patient identified, Emergency Drugs available, Suction available and Patient being monitored Patient Re-evaluated:Patient Re-evaluated prior to induction Oxygen Delivery Method: Circle System Utilized Preoxygenation: Pre-oxygenation with 100% oxygen Induction Type: IV induction Ventilation: Mask ventilation without difficulty LMA: LMA inserted LMA Size: 4.0 Number of attempts: 1 Airway Equipment and Method: Bite block Placement Confirmation: positive ETCO2 Tube secured with: Tape Dental Injury: Teeth and Oropharynx as per pre-operative assessment

## 2024-10-19 NOTE — Op Note (Signed)
 10/19/24  Diana Butler [id number]  OPERATIVE REPORT  Preop Diagnosis: Pre-Op Diagnosis Codes:     * Menorrhagia with regular cycle [N92.0]    * Endometrial mass [N94.89]   Post operative diagnosis: same  Procedure: Procedure(s): DILATATION & CURETTAGE/DIAGNOSTIC HYSTEROSCOPY POLYPECTOMY, MYOSURE RESECTION   Surgeon: Vera LULLA Pa, MD Assistant: none  Anesthesia: MAC Fluids: please see anesthesia report Fluid deficit: 100cc  Complications: None  Findings: Normal cervix. Uterine cavity measuring 7cm with both ostia seen. Small endometrial polyps and scar tissue were noted.   Estimated blood loss: Minimal  Specimens:  ID Type Source Tests Collected by Time Destination  1 : Endometrial polyp and curettings Tissue PATH Gyn benign resection SURGICAL PATHOLOGY Pa Vera LULLA, MD 10/19/2024 1439     Disposition of specimen: Pathology    Procedure: Patient was taken to the OR where she was placed in dorsal lithotomy in stirrups. She voided prior to transport. SCDs were in place.  The patient was prepped and draped in the usual sterile fashion. An adequate timeout was obtained and everyone agreed. A bivalve speculum was placed inside the vagina and the cervix visualized. The cervix was grasped anteriorly with a single-tooth tenaculum. Paracervical block was performed with 1% lidocaine .  The uterus sounded to 7 cm. Sequential cervical dilation was done to 33fr, and the myosure hysteroscope was introduced into the uterine cavity.  Findings as noted. Polyps were seen and completely morcellated using Myosure. The myosure was also used to sample the entire cavity.  A sharp curettage was then performed to ensure complete sampling of the cavity and was sent to pathology. All instrument, sponge and lap counts were correct x2. The patient was awakened taken to recovery room in stable condition.  Vera LULLA Pa, MD 10/19/24  2:50 PM

## 2024-10-19 NOTE — Interval H&P Note (Signed)
 Date of Initial H&P: 10/09/24  History reviewed, patient examined, no change in status, stable for surgery.

## 2024-10-19 NOTE — Transfer of Care (Signed)
 Immediate Anesthesia Transfer of Care Note  Patient: Diana Butler  Procedure(s) Performed: DILATATION & CURETTAGE/DIAGNOSTIC HYSTEROSCOPY POLYPECTOMY, MYOSURE RESECTION  Patient Location: PACU  Anesthesia Type:General  Level of Consciousness: awake and alert   Airway & Oxygen Therapy: Patient Spontanous Breathing  Post-op Assessment: Report given to RN  Post vital signs: Reviewed and stable  Last Vitals:  Vitals Value Taken Time  BP 163/107 10/19/24 14:56  Temp 36.4 C 10/19/24 14:55  Pulse 81 10/19/24 14:59  Resp 20 10/19/24 14:59  SpO2 99 % 10/19/24 14:59  Vitals shown include unfiled device data.  Last Pain:  Vitals:   10/19/24 1255  TempSrc: Oral         Complications: No notable events documented.

## 2024-10-19 NOTE — Anesthesia Preprocedure Evaluation (Signed)
 Anesthesia Evaluation  Patient identified by MRN, date of birth, ID band Patient awake    Reviewed: Allergy & Precautions, H&P , NPO status , Patient's Chart, lab work & pertinent test results  Airway Mallampati: II   Neck ROM: full    Dental   Pulmonary Current Smoker   breath sounds clear to auscultation       Cardiovascular hypertension,  Rhythm:regular Rate:Normal     Neuro/Psych  PSYCHIATRIC DISORDERS  Depression       GI/Hepatic   Endo/Other    Renal/GU      Musculoskeletal   Abdominal   Peds  Hematology   Anesthesia Other Findings   Reproductive/Obstetrics endometriosis                              Anesthesia Physical Anesthesia Plan  ASA: 2  Anesthesia Plan: General   Post-op Pain Management:    Induction: Intravenous  PONV Risk Score and Plan: 2 and Ondansetron , Dexamethasone , Midazolam  and Treatment may vary due to age or medical condition  Airway Management Planned: LMA  Additional Equipment:   Intra-op Plan:   Post-operative Plan: Extubation in OR  Informed Consent: I have reviewed the patients History and Physical, chart, labs and discussed the procedure including the risks, benefits and alternatives for the proposed anesthesia with the patient or authorized representative who has indicated his/her understanding and acceptance.     Dental advisory given  Plan Discussed with: CRNA, Anesthesiologist and Surgeon  Anesthesia Plan Comments:         Anesthesia Quick Evaluation

## 2024-10-20 ENCOUNTER — Encounter (HOSPITAL_COMMUNITY): Payer: Self-pay | Admitting: Obstetrics and Gynecology

## 2024-10-20 NOTE — Anesthesia Postprocedure Evaluation (Signed)
 Anesthesia Post Note  Patient: [name]  Procedure(Butler) Performed: DILATATION & CURETTAGE/DIAGNOSTIC HYSTEROSCOPY POLYPECTOMY, MYOSURE RESECTION     Patient location during evaluation: PACU Anesthesia Type: General Level of consciousness: awake and alert Pain management: pain level controlled Vital Signs Assessment: post-procedure vital signs reviewed and stable Respiratory status: spontaneous breathing, nonlabored ventilation, respiratory function stable and patient connected to nasal cannula oxygen Cardiovascular status: blood pressure returned to baseline and stable Postop Assessment: no apparent nausea or vomiting Anesthetic complications: no   No notable events documented.  Last Vitals:  Vitals:   10/19/24 1515 10/19/24 1525  BP: (!) 162/122 (!) 159/107  Pulse: 60 (!) 50  Resp: 13 10  Temp:  36.4 C  SpO2: 98% 99%    Last Pain:  Vitals:   10/19/24 1515  TempSrc:   PainSc: Asleep                 Diana Butler

## 2024-10-23 LAB — SURGICAL PATHOLOGY

## 2024-10-24 ENCOUNTER — Ambulatory Visit: Payer: Self-pay | Admitting: Obstetrics and Gynecology

## 2024-11-06 ENCOUNTER — Encounter: Admitting: Obstetrics and Gynecology

## 2024-11-07 ENCOUNTER — Encounter: Payer: Self-pay | Admitting: Obstetrics and Gynecology

## 2024-11-07 ENCOUNTER — Ambulatory Visit: Admitting: Obstetrics and Gynecology

## 2024-11-07 VITALS — BP 124/80 | HR 94 | Temp 98.0°F | Wt 162.0 lb

## 2024-11-07 DIAGNOSIS — Z09 Encounter for follow-up examination after completed treatment for conditions other than malignant neoplasm: Secondary | ICD-10-CM

## 2024-11-07 NOTE — Progress Notes (Signed)
 43 y.o. G97P0020 female with AUB, pelvic pain endometriosis, uterine fibroids, s/p endometrial ablation 02/2020, prior BTL (2/2 bilateral ectopic pregnancies) here for postop check: 3wk s/p dx hyst, polyp, D&C for endometrial mass- TVUS. Single.  Patient's last menstrual period was 10/18/2024 (exact date).   Pt states she is doing well. Denies N/V, abdominal pain, VB, dysuria. Normal BM and voids.  10/19/2024 pathology: A. ENDOMETRIUM, CURETTAGE AND POLYP:  - Benign inactive endometrium  - Fragments of benign smooth muscle  - Negative for hyperplasia or malignancy   GYN HISTORY: AS noted  OB History  Gravida Para Term Preterm AB Living  2    2 0  SAB IAB Ectopic Multiple Live Births    2      # Outcome Date GA Lbr Len/2nd Weight Sex Type Anes PTL Lv  2 Ectopic           1 Ectopic            Past Medical History:  Diagnosis Date   Anemia    Endometriosis    per patient report.    Hypertension    Past Surgical History:  Procedure Laterality Date   DILATATION & CURRETTAGE/HYSTEROSCOPY WITH RESECTOCOPE N/A 10/19/2024   Procedure: DILATATION & CURETTAGE/DIAGNOSTIC HYSTEROSCOPY;  Surgeon: Dallie Vera GAILS, MD;  Location: [facility] OR;  Service: Gynecology;  Laterality: N/A;  Myosure   DILITATION & CURRETTAGE/HYSTROSCOPY WITH HYDROTHERMAL ABLATION N/A 02/07/2020   Procedure: DILATATION & CURETTAGE/HYSTEROSCOPY WITH HYDROTHERMAL ABLATION AND  MYOSURE POLYPECTOMY;  Surgeon: Izell Harari, MD;  Location: [facility];  Service: Gynecology;  Laterality: N/A;   ENDOMETRIAL BIOPSY  01/10/2020       MYOSURE RESECTION  10/19/2024   Procedure: POLYPECTOMY, MYOSURE RESECTION;  Surgeon: Dallie Vera GAILS, MD;  Location: [facility] OR;  Service: Gynecology;;   SALPINGECTOMY     right, ectopic. pt states had l/s and ex-lap   SALPINGECTOMY     left, ectopic. pt states had l/s and ex-lap   TONSILLECTOMY     age 50   Current Outpatient Medications on File Prior to Visit  Medication Sig  Dispense Refill   amLODipine (NORVASC) 10 MG tablet Take 10 mg by mouth daily.     ibuprofen  (ADVIL ) 100 MG tablet Take 200 mg by mouth every 6 (six) hours as needed for fever.     Multiple Vitamin (MULTIVITAMIN PO) Take 4 each by mouth daily at 12 noon. gummy     mupirocin ointment (BACTROBAN) 2 % Apply topically 3 (three) times daily.     nicotine  (NICODERM CQ  - DOSED IN MG/24 HOURS) 14 mg/24hr patch RX #1 Weeks 1-6: 14 mg x 1 patch daily. Wear for 24 hours. If you have sleep disturbances, remove at bedtime. (Patient not taking: Reported on 10/13/2024) 42 patch 0   nicotine  (NICODERM CQ  - DOSED IN MG/24 HR) 7 mg/24hr patch RX #2 Weeks 7-8: 7 mg x 1 patch dailyWear for 24 hours. If you have sleep disturbances, remove at bedtime.. (Patient not taking: Reported on 10/13/2024) 14 patch 0   norethindrone  (MICRONOR ) 0.35 MG tablet Take 1 tablet (0.35 mg total) by mouth daily. (Patient not taking: Reported on 09/07/2024) 28 tablet 11   No current facility-administered medications on file prior to visit.   Allergies  Allergen Reactions   Bee Venom     Swelling, hives, throat tightness      PE Today's Vitals   11/07/24 1039  BP: 124/80  Pulse: 94  Temp: 98 F (36.7 C)  TempSrc: Oral  SpO2: 99%  Weight: 162 lb (73.5 kg)    Body mass index is 30.61 kg/m.  Physical Exam Vitals reviewed.  Constitutional:      General: She is not in acute distress.    Appearance: Normal appearance.  HENT:     Head: Normocephalic and atraumatic.     Nose: Nose normal.  Eyes:     Extraocular Movements: Extraocular movements intact.     Conjunctiva/sclera: Conjunctivae normal.  Pulmonary:     Effort: Pulmonary effort is normal.  Abdominal:     General: There is no distension.     Palpations: Abdomen is soft.     Tenderness: There is no abdominal tenderness.  Musculoskeletal:        General: Normal range of motion.     Cervical back: Normal range of motion.  Neurological:     General: No focal  deficit present.     Mental Status: She is alert.  Psychiatric:        Mood and Affect: Mood normal.        Behavior: Behavior normal.       Assessment and Plan:        Postop check  Normal postop exam.  Pathology reviewed with patient. Cleared to return to work. All questions answered. Patient discontinued POP. Reviewed that I have a low threshold for her to restart them with any dysmenorrhea, pelvic pain, or AUB due to hx of endometriosis    Vera LULLA Pa, MD

## 2024-11-15 ENCOUNTER — Ambulatory Visit: Admitting: Obstetrics and Gynecology
